# Patient Record
Sex: Male | Born: 1955
Health system: Southern US, Community
[De-identification: ages and names within clinical notes are randomized; demographics above are authoritative.]

## PROBLEM LIST (undated history)

## (undated) DIAGNOSIS — R569 Unspecified convulsions: Secondary | ICD-10-CM

## (undated) DIAGNOSIS — E7800 Pure hypercholesterolemia, unspecified: Secondary | ICD-10-CM

## (undated) DIAGNOSIS — M1990 Unspecified osteoarthritis, unspecified site: Secondary | ICD-10-CM

## (undated) DIAGNOSIS — I10 Essential (primary) hypertension: Secondary | ICD-10-CM

## (undated) HISTORY — PX: OTHER SURGICAL HISTORY: SHX169

## (undated) HISTORY — DX: Essential (primary) hypertension: I10

## (undated) HISTORY — DX: Unspecified osteoarthritis, unspecified site: M19.90

## (undated) HISTORY — DX: Pure hypercholesterolemia, unspecified: E78.00

## (undated) HISTORY — DX: Unspecified convulsions: R56.9

---

## 2000-08-26 ENCOUNTER — Encounter: Payer: Self-pay | Admitting: Emergency Medicine

## 2000-08-26 ENCOUNTER — Emergency Department (HOSPITAL_COMMUNITY): Admission: EM | Admit: 2000-08-26 | Discharge: 2000-08-26 | Payer: Self-pay | Admitting: Emergency Medicine

## 2007-10-04 ENCOUNTER — Ambulatory Visit: Payer: Self-pay | Admitting: Gastroenterology

## 2007-10-15 ENCOUNTER — Ambulatory Visit: Payer: Self-pay | Admitting: Gastroenterology

## 2007-10-15 HISTORY — PX: COLONOSCOPY: SHX174

## 2013-07-22 ENCOUNTER — Other Ambulatory Visit: Payer: Self-pay

## 2013-07-22 MED ORDER — CARBAMAZEPINE 200 MG PO TABS: 200.0000 mg | ORAL_TABLET | Freq: Two times a day (BID) | ORAL | Status: DC

## 2013-08-02 ENCOUNTER — Encounter: Payer: Self-pay | Admitting: Nurse Practitioner

## 2013-08-05 ENCOUNTER — Encounter: Payer: Self-pay | Admitting: Nurse Practitioner

## 2013-08-05 ENCOUNTER — Ambulatory Visit (INDEPENDENT_AMBULATORY_CARE_PROVIDER_SITE_OTHER): Payer: BC Managed Care – PPO | Admitting: Nurse Practitioner

## 2013-08-05 VITALS — BP 140/83 | HR 59 | Ht 72.0 in | Wt 178.0 lb

## 2013-08-05 DIAGNOSIS — G40109 Localization-related (focal) (partial) symptomatic epilepsy and epileptic syndromes with simple partial seizures, not intractable, without status epilepticus: Secondary | ICD-10-CM

## 2013-08-05 DIAGNOSIS — Z79899 Other long term (current) drug therapy: Secondary | ICD-10-CM

## 2013-08-05 DIAGNOSIS — G40309 Generalized idiopathic epilepsy and epileptic syndromes, not intractable, without status epilepticus: Secondary | ICD-10-CM

## 2013-08-05 MED ORDER — CARBAMAZEPINE 200 MG PO TABS: 400.0000 mg | ORAL_TABLET | Freq: Every day | ORAL | Status: DC

## 2013-08-05 NOTE — Progress Notes (Signed)
I have read the note, and I agree with the clinical assessment and plan.  

## 2013-08-05 NOTE — Progress Notes (Signed)
  Reason for visit   followup seizure disorder  HPI:. [name], 57 year-old white male returns today for followup. He has a history of seizure disorder generalized, is  currently well controlled on carbamazepine. He has been followed at  [facility] since 1981. EEG has demonstrated slowing in the right hemisphere, onset of seizures occurred at age 74, presumptive of prenatal difficulty. His last seizure occurred in August of 2001 after missing several doses of his medication. He denies any difficulty tolerating his medications, no difficulty with daytime drowsiness, no falls, no feelings of being off balance. He's had no visual difficulties, he has rare headache. Labs were not done at last visit. Patient does not have PCP.   ROS:  Negative   Medications: Epitol 200mg  2 at HS  Allergies No Known Allergies  Physical Exam General: well developed, well nourished, seated, in no evident distress Head: head normocephalic and atraumatic. Oropharynx benign Neck: supple with no carotid  bruits Cardiovascular: regular rate and rhythm, no murmurs  Neurologic Exam Mental Status: Awake and fully alert. Oriented to place and time. Follows all commands. Speech and language normal.   Cranial Nerves: Pupils equal, briskly reactive to light. Extraocular movements full without nystagmus. Visual fields full to confrontation. Hearing intact and symmetric to finger snap. Facial sensation intact. Face, tongue, palate move normally and symmetrically. Neck flexion and extension normal.  Motor: Normal bulk and tone. Normal strength in all tested extremity muscles.No focal weakness Coordination: Rapid alternating movements normal in all extremities. Finger-to-nose and heel-to-shin performed accurately bilaterally. Gait and Station: Arises from chair without difficulty. Stance is normal. Gait demonstrates normal stride length and balance . Able to heel, toe and tandem walk without difficulty.  Reflexes: 2+ and symmetric. Toes  downgoing.     ASSESSMENT: Generalized seizure disorder well controlled, last seizure August 2001     PLAN: Will renew medication for the next year Will check CBC, CMP, and carbamazepine level  Followup yearly and when necessary Nilda Riggs, GNP-BC APRN

## 2013-08-05 NOTE — Patient Instructions (Addendum)
.   Shane Phillips, 57 year-old white male returns today for followup. He has a history of seizure disorder generalized, is  currently well controlled on carbamazepine. He has been followed at  [facility] since 1981. EEG has demonstrated slowing in the right hemisphere, onset of seizures occurred at age 68, presumptive of prenatal difficulty. His last seizure occurred in August of 2001 after missing several doses of his medication. He denies any difficulty tolerating his medications, no difficulty with daytime drowsiness, no falls, no feelings of being off balance. He's had no visual difficulties, he has rare headache. See ROS.

## 2013-08-06 ENCOUNTER — Telehealth: Payer: Self-pay

## 2013-08-06 LAB — CBC WITH DIFFERENTIAL/PLATELET
Basophils Absolute: 0 10*3/uL (ref 0.0–0.2)
Basos: 0 % (ref 0–3)
Eos: 1 % (ref 0–5)
Eosinophils Absolute: 0.1 10*3/uL (ref 0.0–0.4)
HCT: 40.3 % (ref 37.5–51.0)
Hemoglobin: 14.1 g/dL (ref 12.6–17.7)
Lymphocytes Absolute: 1.3 10*3/uL (ref 0.7–3.1)
Lymphs: 31 % (ref 14–46)
MCH: 31.8 pg (ref 26.6–33.0)
MCHC: 35 g/dL (ref 31.5–35.7)
MCV: 91 fL (ref 79–97)
Monocytes Absolute: 0.5 10*3/uL (ref 0.1–0.9)
Monocytes: 11 % (ref 4–12)
Neutrophils Absolute: 2.5 10*3/uL (ref 1.4–7.0)
Neutrophils Relative %: 57 % (ref 40–74)
RBC: 4.44 x10E6/uL (ref 4.14–5.80)
RDW: 13.6 % (ref 12.3–15.4)
WBC: 4.3 10*3/uL (ref 3.4–10.8)

## 2013-08-06 LAB — COMPREHENSIVE METABOLIC PANEL
ALT: 15 IU/L (ref 0–44)
AST: 18 IU/L (ref 0–40)
Albumin/Globulin Ratio: 1.8 (ref 1.1–2.5)
Albumin: 4.4 g/dL (ref 3.5–5.5)
Alkaline Phosphatase: 74 IU/L (ref 39–117)
BUN/Creatinine Ratio: 15 (ref 9–20)
BUN: 13 mg/dL (ref 6–24)
CO2: 27 mmol/L (ref 18–29)
Calcium: 9.2 mg/dL (ref 8.7–10.2)
Chloride: 100 mmol/L (ref 96–108)
Creatinine, Ser: 0.87 mg/dL (ref 0.76–1.27)
GFR calc Af Amer: 112 mL/min/{1.73_m2} (ref >59)
GFR calc non Af Amer: 96 mL/min/{1.73_m2} (ref >59)
Globulin, Total: 2.4 g/dL (ref 1.5–4.5)
Glucose: 55 mg/dL — ABNORMAL LOW (ref 65–99)
Potassium: 4.2 mmol/L (ref 3.5–5.2)
Sodium: 140 mmol/L (ref 134–144)
Total Bilirubin: 0.3 mg/dL (ref 0.0–1.2)
Total Protein: 6.8 g/dL (ref 6.0–8.5)

## 2013-08-06 LAB — CARBAMAZEPINE LEVEL, TOTAL: Carbamazepine Lvl: 6.4 ug/mL (ref 4.0–12.0)

## 2013-08-06 NOTE — Telephone Encounter (Signed)
Message copied by [facility] on Tue Aug 06, 2013 10:51 AM ------      Message from: Beverely Low      Created: Tue Aug 06, 2013  9:11 AM       Labs look good except glucose low. Continue same dose of meds.             ----- Message -----         From: [facility] Lab Results In Interface         Sent: 08/06/2013   5:45 AM           To: Nilda Riggs, NP                   ------

## 2013-08-06 NOTE — Telephone Encounter (Signed)
I called patient and let him know that his lab results look good. The only thing that was abnormal was his blood glucose which was low but, there is no notation that anything needs to be done with that. Continue medications as ordered. I asked patient if he had any questions and if he had a follow up appointment. He did not have questions and stated he has an appointment in one year. That is confirmed.

## 2014-07-29 ENCOUNTER — Telehealth: Payer: Self-pay | Admitting: *Deleted

## 2014-07-29 ENCOUNTER — Ambulatory Visit: Payer: Self-pay | Admitting: Nurse Practitioner

## 2014-07-29 ENCOUNTER — Ambulatory Visit (INDEPENDENT_AMBULATORY_CARE_PROVIDER_SITE_OTHER): Payer: BC Managed Care – PPO | Admitting: Nurse Practitioner

## 2014-07-29 ENCOUNTER — Encounter: Payer: Self-pay | Admitting: Nurse Practitioner

## 2014-07-29 VITALS — BP 149/93 | HR 64 | Ht 72.0 in | Wt 177.4 lb

## 2014-07-29 DIAGNOSIS — Z5181 Encounter for therapeutic drug level monitoring: Secondary | ICD-10-CM

## 2014-07-29 DIAGNOSIS — G40109 Localization-related (focal) (partial) symptomatic epilepsy and epileptic syndromes with simple partial seizures, not intractable, without status epilepticus: Secondary | ICD-10-CM

## 2014-07-29 DIAGNOSIS — G40309 Generalized idiopathic epilepsy and epileptic syndromes, not intractable, without status epilepticus: Secondary | ICD-10-CM

## 2014-07-29 MED ORDER — CARBAMAZEPINE 200 MG PO TABS: 400.0000 mg | ORAL_TABLET | Freq: Every day | ORAL | Status: DC

## 2014-07-29 NOTE — Patient Instructions (Signed)
Will check labs today Renew medication (Epitol ) Call for any seizure activity Followup yearly and when necessary

## 2014-07-29 NOTE — Progress Notes (Signed)
I have read the note, and I agree with the clinical assessment and plan.  Anali Cabanilla KEITH   

## 2014-07-29 NOTE — Progress Notes (Signed)
[facility]  PATIENT: Shane Phillips DOB: [birth year]   REASON FOR VISIT: follow up for seizure disorder    HISTORY OF PRESENT ILLNESS:Shane Phillips, 58 year-old white male returns today for followup. He has a history of seizure disorder generalized, is currently well controlled on carbamazepine. He has been followed at [facility] since 1981. EEG has demonstrated slowing in the right hemisphere, onset of seizures occurred at age 36, presumptive of prenatal difficulty. His last seizure occurred in August of 2001 after missing several doses of his medication. He denies any difficulty tolerating his medications, no difficulty with daytime drowsiness, no falls, no feelings of being off balance. He's had no visual difficulties, he has rare headache.  Patient does not have PCP. He returns for reevaluation. He needs refills on his medication and labs.     REVIEW OF SYSTEMS: Full 14 system review of systems performed and notable only for those listed, all others are neg:  Constitutional: N/A  Cardiovascular: N/A  Ear/Nose/Throat: N/A  Skin: N/A  Eyes: N/A  Respiratory: N/A  Gastroitestinal: N/A  Hematology/Lymphatic: N/A  Endocrine: N/A Musculoskeletal: Joint pain he has Allergy/Immunology: N/A  Neurological: N/A Psychiatric: N/A Sleep : NA   ALLERGIES: No Known Allergies  HOME MEDICATIONS: Outpatient Prescriptions Prior to Visit  Medication Sig Dispense Refill  . carbamazepine (EPITOL) 200 MG tablet Take 2 tablets (400 mg total) by mouth at bedtime.  60 tablet  11   No facility-administered medications prior to visit.    PAST MEDICAL HISTORY: Past Medical History  Diagnosis Date  . Seizure   . High cholesterol     PAST SURGICAL HISTORY: Past Surgical History  Procedure Laterality Date  . None      FAMILY HISTORY: Family History  Problem Relation Age of Onset  . Leukemia Mother   . Heart attack Brother     2 in 6 months   . Cancer Mother   . Cancer  Sister     SOCIAL HISTORY: History   Social History  . Marital Status: Married    Spouse Name: [name]    Number of Children: N/A  . Years of Education: N/A   Occupational History  . Self Empolyed    Social History Main Topics  . Smoking status: Never Smoker   . Smokeless tobacco: Never Used  . Alcohol Use: No  . Drug Use: No  . Sexual Activity: Not on file   Other Topics Concern  . 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   Patient is Married to [name].            PHYSICAL EXAM  Filed Vitals:   07/29/14 1429  BP: 149/93  Pulse: 64  Height: 6' (1.829 m)  Weight: 177 lb 6.4 oz (80.468 kg)   Body mass index is 24.05 kg/(m^2). General: well developed, well nourished, seated, in no evident distress  Head: head normocephalic and atraumatic. Oropharynx benign  Neck: supple with no carotid bruits  Cardiovascular: regular rate and rhythm, no murmurs  Neurologic Exam  Mental Status: Awake and fully alert. Oriented to place and time. Follows all commands. Speech and language normal.  Cranial Nerves: Pupils equal, briskly reactive to light. Extraocular movements full without nystagmus. Visual fields full to confrontation. Hearing intact and symmetric to finger snap. Facial sensation intact. Face, tongue, palate move normally and symmetrically. Neck flexion and extension normal.  Motor: Normal bulk and tone. Normal strength in all tested extremity  muscles.No focal weakness  Coordination: Rapid alternating movements normal in all extremities. Finger-to-nose and heel-to-shin performed accurately bilaterally.  Gait and Station: Arises from chair without difficulty. Stance is normal. Gait demonstrates normal stride length and balance . Able to heel, toe and tandem walk without difficulty.  Reflexes: 2+ and symmetric. Toes downgoing.     DIAGNOSTIC DATA (LABS, IMAGING, TESTING) - I reviewed patient records, labs, notes, testing and  imaging myself where available.  Lab Results  Component Value Date   WBC 4.3 08/05/2013   HGB 14.1 08/05/2013   HCT 40.3 08/05/2013   MCV 91 08/05/2013      Component Value Date/Time   NA 140 08/05/2013 0942   K 4.2 08/05/2013 0942   CL 100 08/05/2013 0942   CO2 27 08/05/2013 0942   GLUCOSE 55* 08/05/2013 0942   BUN 13 08/05/2013 0942   CREATININE 0.87 08/05/2013 0942   CALCIUM 9.2 08/05/2013 0942   PROT 6.8 08/05/2013 0942   AST 18 08/05/2013 0942   ALT 15 08/05/2013 0942   ALKPHOS 74 08/05/2013 0942   BILITOT 0.3 08/05/2013 0942   GFRNONAA 96 08/05/2013 0942   GFRAA 112 08/05/2013 0942      ASSESSMENT AND PLAN  58 y.o. year old male  has a past medical history of Seizure here to followup. Last seizure occurred August 2001. He is currently well controlled on Epitol.   Will check labs today, CBC, CMP and CBZ level Renew medication (Epitol ) Call for any seizure activity Followup yearly and when necessary [name], [facility], [facility], [facility] [address], [place], [place] [address] [phone]

## 2014-07-29 NOTE — Telephone Encounter (Signed)
Calling patient to r/s appointment from 08/05/14 due to NP CM unavailable for admin time, patient was r/s to 07/29/14 at 2:30.

## 2014-07-30 LAB — COMPREHENSIVE METABOLIC PANEL
ALT: 18 IU/L (ref 0–44)
AST: 19 IU/L (ref 0–40)
Albumin/Globulin Ratio: 1.8 (ref 1.1–2.5)
Albumin: 4.5 g/dL (ref 3.5–5.5)
Alkaline Phosphatase: 77 IU/L (ref 39–117)
BUN/Creatinine Ratio: 19 (ref 9–20)
BUN: 18 mg/dL (ref 6–24)
CO2: 26 mmol/L (ref 18–29)
Calcium: 9.3 mg/dL (ref 8.7–10.2)
Chloride: 101 mmol/L (ref 97–108)
Creatinine, Ser: 0.96 mg/dL (ref 0.76–1.27)
GFR calc Af Amer: 101 mL/min/{1.73_m2} (ref >59)
GFR calc non Af Amer: 87 mL/min/{1.73_m2} (ref >59)
Globulin, Total: 2.5 g/dL (ref 1.5–4.5)
Glucose: 85 mg/dL (ref 65–99)
Potassium: 4.2 mmol/L (ref 3.5–5.2)
Sodium: 141 mmol/L (ref 134–144)
Total Bilirubin: 0.3 mg/dL (ref 0.0–1.2)
Total Protein: 7 g/dL (ref 6.0–8.5)

## 2014-07-30 LAB — CBC WITH DIFFERENTIAL/PLATELET
Basophils Absolute: 0 10*3/uL (ref 0.0–0.2)
Basos: 0 %
Eos: 2 %
Eosinophils Absolute: 0.1 10*3/uL (ref 0.0–0.4)
HCT: 39.5 % (ref 37.5–51.0)
Hemoglobin: 13.7 g/dL (ref 12.6–17.7)
Immature Grans (Abs): 0 10*3/uL (ref 0.0–0.1)
Immature Granulocytes: 0 %
Lymphocytes Absolute: 1.7 10*3/uL (ref 0.7–3.1)
Lymphs: 36 %
MCH: 31.7 pg (ref 26.6–33.0)
MCHC: 34.7 g/dL (ref 31.5–35.7)
MCV: 91 fL (ref 79–97)
Monocytes Absolute: 0.5 10*3/uL (ref 0.1–0.9)
Monocytes: 11 %
Neutrophils Absolute: 2.4 10*3/uL (ref 1.4–7.0)
Neutrophils Relative %: 51 %
RBC: 4.32 x10E6/uL (ref 4.14–5.80)
RDW: 13.8 % (ref 12.3–15.4)
WBC: 4.7 10*3/uL (ref 3.4–10.8)

## 2014-07-30 LAB — CARBAMAZEPINE LEVEL, TOTAL: Carbamazepine Lvl: 7.1 ug/mL (ref 4.0–12.0)

## 2014-07-30 NOTE — Progress Notes (Signed)
Quick Note:  Called patient left voice message of normal labs, instructed patient to call back with any questions or concerns. ______

## 2014-08-05 ENCOUNTER — Ambulatory Visit: Payer: BC Managed Care – PPO | Admitting: Nurse Practitioner

## 2015-05-28 ENCOUNTER — Encounter: Payer: Self-pay | Admitting: Physician Assistant

## 2015-05-28 ENCOUNTER — Ambulatory Visit (INDEPENDENT_AMBULATORY_CARE_PROVIDER_SITE_OTHER): Payer: 59 | Admitting: Physician Assistant

## 2015-05-28 VITALS — BP 163/106 | HR 55 | Temp 98.5°F | Resp 16 | Ht 71.0 in | Wt 178.0 lb

## 2015-05-28 DIAGNOSIS — Z0000 Encounter for general adult medical examination without abnormal findings: Secondary | ICD-10-CM | POA: Diagnosis not present

## 2015-05-28 DIAGNOSIS — Z130 Encounter for screening for diseases of the blood and blood-forming organs and certain disorders involving the immune mechanism: Secondary | ICD-10-CM

## 2015-05-28 DIAGNOSIS — Z23 Encounter for immunization: Secondary | ICD-10-CM | POA: Diagnosis not present

## 2015-05-28 DIAGNOSIS — Z13220 Encounter for screening for lipoid disorders: Secondary | ICD-10-CM

## 2015-05-28 LAB — LIPID PANEL
Cholesterol: 259 mg/dL — ABNORMAL HIGH (ref 0–200)
HDL: 77 mg/dL (ref >=40)
LDL Cholesterol: 169 mg/dL — ABNORMAL HIGH (ref 0–99)
Total CHOL/HDL Ratio: 3.4 Ratio
Triglycerides: 67 mg/dL (ref <150)
VLDL: 13 mg/dL (ref 0–40)

## 2015-05-28 LAB — CBC
HCT: 40.9 % (ref 39.0–52.0)
Hemoglobin: 14.4 g/dL (ref 13.0–17.0)
MCH: 31.7 pg (ref 26.0–34.0)
MCHC: 35.2 g/dL (ref 30.0–36.0)
MCV: 90.1 fL (ref 78.0–100.0)
MPV: 10.7 fL (ref 8.6–12.4)
Platelets: 217 10*3/uL (ref 150–400)
RBC: 4.54 MIL/uL (ref 4.22–5.81)
RDW: 13.7 % (ref 11.5–15.5)
WBC: 5 10*3/uL (ref 4.0–10.5)

## 2015-05-28 LAB — COMPLETE METABOLIC PANEL WITH GFR
ALT: 25 U/L (ref 0–53)
AST: 24 U/L (ref 0–37)
Albumin: 4.3 g/dL (ref 3.5–5.2)
Alkaline Phosphatase: 83 U/L (ref 39–117)
BUN: 21 mg/dL (ref 6–23)
CO2: 29 mEq/L (ref 19–32)
Calcium: 8.9 mg/dL (ref 8.4–10.5)
Chloride: 102 mEq/L (ref 96–112)
Creat: 0.86 mg/dL (ref 0.50–1.35)
GFR, Est African American: >89 mL/min
GFR, Est Non African American: >89 mL/min
Glucose, Bld: 84 mg/dL (ref 70–99)
Potassium: 3.9 mEq/L (ref 3.5–5.3)
Sodium: 140 mEq/L (ref 135–145)
Total Bilirubin: 0.5 mg/dL (ref 0.2–1.2)
Total Protein: 6.8 g/dL (ref 6.0–8.3)

## 2015-05-28 LAB — POCT UA - MICROSCOPIC ONLY
Bacteria, U Microscopic: NEGATIVE
Crystals, Ur, HPF, POC: NEGATIVE
Epithelial cells, urine per micros: NEGATIVE
Mucus, UA: NEGATIVE
RBC, urine, microscopic: NEGATIVE

## 2015-05-28 LAB — POCT URINALYSIS DIPSTICK
Bilirubin, UA: NEGATIVE
Blood, UA: NEGATIVE
Glucose, UA: NEGATIVE
Leukocytes, UA: NEGATIVE
Nitrite, UA: NEGATIVE
Protein, UA: NEGATIVE
Spec Grav, UA: 1.02
Urobilinogen, UA: 0.2
pH, UA: 7

## 2015-05-28 NOTE — Progress Notes (Signed)
[facility] [address] [place] [address] [phone]  Date:  05/28/2015   Name:  Shane Phillips   DOB:  08-21-1956   MRN:  [id number]  PCP:  No primary care provider on file.    History of Present Illness:  Shane Phillips is a 59 y.o. male patient who presents to [facility] for annual physical exam. He has no concerns or complaints at this time.  He has no pain, dysuria, or hematuria.  No family hx of prostate cancer or BPH.  There is no power with his urination.  He may find leakage in his underwear.  He has a history of epilepsy, that is well controlled on his epileptic medication. He has not had an event for over a year. And that event was due to a lack of medication for about 3 days.  Bowels: No blood in the stool.  No constipation or diarrhea.  Diet: Avoids "junk food, but needs to drink more vegetabless.  He has a Information systems manager.  He does a lot of walking, but no aerobic exercise.        Patient Active Problem List   Diagnosis Date Noted  . 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 Generalized convulsive epilepsy without mention of intractable epilepsy 08/05/2013    Past Medical History  Diagnosis Date  . Seizure   . High cholesterol     Past Surgical History  Procedure Laterality Date  . None      History  Substance Use Topics  . Smoking status: Never Smoker   . Smokeless tobacco: Never Used  . Alcohol Use: No    Family History  Problem Relation Age of Onset  . Leukemia Mother   . Cancer Mother   . Heart attack Brother     2 in 6 months   . Hyperlipidemia Brother   . Cancer Sister     breast cancer  . Hyperlipidemia Sister   . Cancer Father     bladder cancer  . Cancer Maternal Grandfather     lung cancer  . Cancer Paternal Grandfather     melanoma  . Hyperlipidemia Sister     No Known Allergies  Medication  list has been reviewed and updated.  Current Outpatient Prescriptions on File Prior to Visit  Medication Sig Dispense Refill  . carbamazepine (EPITOL) 200 MG tablet Take 2 tablets (400 mg total) by mouth at bedtime. 180 tablet 3   No current facility-administered medications on file prior to visit.    ROS   Physical Examination: BP 160/88 mmHg  Pulse 60  Temp(Src) 98.5 F (36.9 C)  Resp 16  Ht 5\' 11"  (1.803 m)  Wt 178 lb (80.74 kg)  BMI 24.84 kg/m2  SpO2 98% Ideal Body Weight: Weight in (lb) to have BMI = 25: 178.9  Physical Exam  Constitutional: He is oriented to person, place, and time. He appears well-developed and well-nourished. No distress.  HENT:  Head: Normocephalic and atraumatic.  Right Ear: External ear normal.  Left Ear: External ear normal.  Nose: Nose normal.  Mouth/Throat: Oropharynx is clear and moist. No oropharyngeal exudate.  Eyes: Conjunctivae and EOM are normal. Pupils are equal, round, and reactive to light. Right eye exhibits no discharge. Left eye exhibits no discharge.  Neck: Normal range of motion. Neck supple.  Cardiovascular: Normal rate, regular rhythm and normal heart sounds.  Exam reveals no friction rub.  No murmur heard. Pulmonary/Chest: Effort normal and breath sounds normal. No respiratory distress. He has no wheezes.  Abdominal: Soft. Bowel sounds are normal. He exhibits no distension. There is no tenderness. No hernia. Hernia confirmed negative in the right inguinal area and confirmed negative in the left inguinal area.  Genitourinary: Rectum normal, prostate normal, testes normal and penis normal.  Neurological: He is alert and oriented to person, place, and time. He has normal reflexes. No cranial nerve deficit.  Skin: Skin is warm and dry. He is not diaphoretic.  Psychiatric: He has a normal mood and affect. His behavior is normal.   CBC    Component Value Date/Time   WBC 5.0 05/28/2015 1617   WBC 4.7 07/29/2014 1455   RBC 4.54  05/28/2015 1617   RBC 4.32 07/29/2014 1455   HGB 14.4 05/28/2015 1617   HCT 40.9 05/28/2015 1617   PLT 217 05/28/2015 1617   MCV 90.1 05/28/2015 1617   MCH 31.7 05/28/2015 1617   MCH 31.7 07/29/2014 1455   MCHC 35.2 05/28/2015 1617   MCHC 34.7 07/29/2014 1455   RDW 13.7 05/28/2015 1617   RDW 13.8 07/29/2014 1455   LYMPHSABS 1.7 07/29/2014 1455   EOSABS 0.1 07/29/2014 1455   BASOSABS 0.0 07/29/2014 1455   Urinalysis    Component Value Date/Time   BILIRUBINUR Negative 05/28/2015 1642   PROTEINUR Negative 05/28/2015 1642   UROBILINOGEN 0.2 05/28/2015 1642   NITRITE Negative 05/28/2015 1642   LEUKOCYTESUR Negative 05/28/2015 1642     Assessment and Plan: 59 year old male with PMH listed above is here today for physical exam.  I will await PSA, in treating this possible.  Labs ordered below 1. Annual physical exam - PSA - POCT UA - Microscopic Only - POCT urinalysis dipstick - Lipid panel - COMPLETE METABOLIC PANEL WITH GFR - Tdap vaccine greater than or equal to 7yo IM  2. Need for Tdap vaccination - Tdap vaccine greater than or equal to 7yo IM  3. Screening for deficiency anemia - CBC  4. Screening for lipid disorders - Lipid panel

## 2015-05-28 NOTE — Patient Instructions (Signed)
I will have your lab results in the next 10 days.    Keeping you healthy  Get these tests  Blood pressure- Have your blood pressure checked once a year by your healthcare provider.  Normal blood pressure is 120/80  Weight- Have your body mass index (BMI) calculated to screen for obesity.  BMI is a measure of body fat based on height and weight. You can also calculate your own BMI at ViewBanking.si.  Cholesterol- Have your cholesterol checked every year.  Diabetes- Have your blood sugar checked regularly if you have high blood pressure, high cholesterol, have a family history of diabetes or if you are overweight.  Screening for Colon Cancer- Colonoscopy starting at age 88.  Screening may begin sooner depending on your family history and other health conditions. Follow up colonoscopy as directed by your Gastroenterologist.  Screening for Prostate Cancer- Both blood work (PSA) and a rectal exam help screen for Prostate Cancer.  Screening begins at age 19 with African-American men and at age 51 with Caucasian men.  Screening may begin sooner depending on your family history.  Take these medicines  Aspirin- One aspirin daily can help prevent Heart disease and Stroke.  Flu shot- Every fall.  Tetanus- Every 10 years.  Zostavax- Once after the age of 67 to prevent Shingles.  Pneumonia shot- Once after the age of 21; if you are younger than 53, ask your healthcare provider if you need a Pneumonia shot.  Take these steps  Don't smoke- If you do smoke, talk to your doctor about quitting.  For tips on how to quit, go to www.smokefree.gov or call 1-800-QUIT-NOW.  Be physically active- Exercise 5 days a week for at least 30 minutes.  If you are not already physically active start slow and gradually work up to 30 minutes of moderate physical activity.  Examples of moderate activity include walking briskly, mowing the yard, dancing, swimming, bicycling, etc.  Eat a healthy diet- Eat a  variety of healthy food such as fruits, vegetables, low fat milk, low fat cheese, yogurt, lean meant, poultry, fish, beans, tofu, etc. For more information go to www.thenutritionsource.org  Drink alcohol in moderation- Limit alcohol intake to less than two drinks a day. Never drink and drive.  Dentist- Brush and floss twice daily; visit your dentist twice a year.  Depression- Your emotional health is as important as your physical health. If you're feeling down, or losing interest in things you would normally enjoy please talk to your healthcare provider.  Eye exam- Visit your eye doctor every year.  Safe sex- If you may be exposed to a sexually transmitted infection, use a condom.  Seat belts- Seat belts can save your life; always wear one.  Smoke/Carbon Monoxide detectors- These detectors need to be installed on the appropriate level of your home.  Replace batteries at least once a year.  Skin cancer- When out in the sun, cover up and use sunscreen 15 SPF or higher.  Violence- If anyone is threatening you, please tell your healthcare provider.  Living Will/ Health care power of attorney- Speak with your healthcare provider and family.

## 2015-05-29 LAB — PSA: PSA: 1.25 ng/mL (ref <=4.00)

## 2015-06-18 ENCOUNTER — Telehealth: Payer: Self-pay | Admitting: Physician Assistant

## 2015-06-18 ENCOUNTER — Encounter: Payer: Self-pay | Admitting: Physician Assistant

## 2015-06-18 ENCOUNTER — Other Ambulatory Visit: Payer: Self-pay | Admitting: Physician Assistant

## 2015-06-18 NOTE — Telephone Encounter (Signed)
Discussed with patient his results: Lipid panel can warrant medication to decrease cardiovascular risk.  He declines this at this time.  He declines this.  We have agreed to work on exercise and appropriate diet at this time.  He will return in 3 months for lab recheck of lipid panel.  I also asked about starting doxazosin to combat htn as well as possibly decreasing urinary dribbling.  He declines this as well and would like to work on lifestyle choices.  He purchased a BP cuff, and will check it at home.  I have asked him to make sure that it is 140/90 or lower.  I suggested that he have it manually checked, with his machine, to insure accuracy.

## 2015-07-30 ENCOUNTER — Encounter: Payer: Self-pay | Admitting: Nurse Practitioner

## 2015-07-30 ENCOUNTER — Ambulatory Visit (INDEPENDENT_AMBULATORY_CARE_PROVIDER_SITE_OTHER): Payer: 59 | Admitting: Nurse Practitioner

## 2015-07-30 VITALS — BP 144/85 | HR 72 | Ht 72.0 in | Wt 174.5 lb

## 2015-07-30 DIAGNOSIS — G40109 Localization-related (focal) (partial) symptomatic epilepsy and epileptic syndromes with simple partial seizures, not intractable, without status epilepticus: Secondary | ICD-10-CM

## 2015-07-30 DIAGNOSIS — Z5181 Encounter for therapeutic drug level monitoring: Secondary | ICD-10-CM | POA: Diagnosis not present

## 2015-07-30 DIAGNOSIS — G40309 Generalized idiopathic epilepsy and epileptic syndromes, not intractable, without status epilepticus: Secondary | ICD-10-CM

## 2015-07-30 MED ORDER — CARBAMAZEPINE 200 MG PO TABS
400.0000 mg | ORAL_TABLET | Freq: Every day | ORAL | Status: DC
Start: 2015-07-30 — End: 2016-08-01

## 2015-07-30 NOTE — Patient Instructions (Signed)
Will check labs today, CBC, CMP and CBZ level Renew medication (Epitol ) Call for any seizure activity Followup yearly and when necessary

## 2015-07-30 NOTE — Progress Notes (Signed)
[facility]  PATIENT: Shane Phillips DOB: 1956/06/22   REASON FOR VISIT:follow-up for seizure disorder HISTORY FROM:patient    HISTORY OF PRESENT ILLNESS:Shane Phillips, 59 year-old white male returns today for followup. He has a history of seizure disorder generalized, is currently well controlled on carbamazepine. He has been followed at [facility] since 1981. EEG has demonstrated slowing in the right hemisphere, onset of seizures occurred at age 27, presumptive of prenatal difficulty. His last seizure occurred in August of 2001 after missing several doses of his medication. He denies any difficulty tolerating his medications, no difficulty with daytime drowsiness, no falls, no feelings of being off balance. He's had no visual difficulties, he has rare headache. Patient does not have PCP. He returns for reevaluation. He needs refills on his medication and labs.   REVIEW OF SYSTEMS: Full 14 system review of systems performed and notable only for those listed, all others are neg:  Constitutional: neg  Cardiovascular: neg Ear/Nose/Throat: neg  Skin: neg Eyes: neg Respiratory: neg Gastroitestinal: neg  Hematology/Lymphatic: neg  Endocrine: neg Musculoskeletal:neg Allergy/Immunology: neg Neurological: neg Psychiatric: neg Sleep : neg   ALLERGIES: No Known Allergies  HOME MEDICATIONS: Outpatient Prescriptions Prior to Visit  Medication Sig Dispense Refill  . carbamazepine (EPITOL) 200 MG tablet Take 2 tablets (400 mg total) by mouth at bedtime. 180 tablet 3   No facility-administered medications prior to visit.    PAST MEDICAL HISTORY: Past Medical History  Diagnosis Date  . Seizure   . High cholesterol   . Hypertension     PAST SURGICAL HISTORY: Past Surgical History  Procedure Laterality Date  . None      FAMILY HISTORY: Family History  Problem Relation Age of Onset  . Leukemia Mother   . Cancer Mother   . Heart attack Brother     2 in 6 months     . Hyperlipidemia Brother   . Cancer Sister     breast cancer  . Hyperlipidemia Sister   . Cancer Father     bladder cancer  . Cancer Maternal Grandfather     lung cancer  . Cancer Paternal Grandfather     melanoma  . Hyperlipidemia Sister     SOCIAL HISTORY: History   Social History  . Marital Status: Married    Spouse Name: Shane Phillips  . Number of Children: N/A  . Years of Education: N/A   Occupational History  . Self Empolyed    Social History Main Topics  . Smoking status: Never Smoker   . Smokeless tobacco: Never Used  . Alcohol Use: No  . Drug Use: No  . Sexual Activity: Yes   Other Topics Concern  . Not on file   Social History Narrative   Patient owns a [employer].   Patient is Married to [place].            PHYSICAL EXAM  Filed Vitals:   07/30/15 1516  BP: 144/85  Pulse: 72  Height: 6' (1.829 m)  Weight: 174 lb 8 oz (79.153 kg)   Body mass index is 23.66 kg/(m^2). General: well developed, well nourished, seated, in no evident distress  Head: head normocephalic and atraumatic. Oropharynx benign  Neck: supple with no carotid bruits  Neurologic Exam  Mental Status: Awake and fully alert. Oriented to place and time. Follows all commands. Speech and language normal.  Cranial Nerves: Pupils equal, briskly reactive to light. Extraocular movements full without nystagmus. Visual fields full to confrontation. Hearing intact and  symmetric to finger snap. Facial sensation intact. Face, tongue, palate move normally and symmetrically. Neck flexion and extension normal.  Motor: Normal bulk and tone. Normal strength in all tested extremity muscles.No focal weakness  Coordination: Rapid alternating movements normal in all extremities. Finger-to-nose and heel-to-shin performed accurately bilaterally.  Gait and Station: Arises from chair without difficulty. Stance is normal. Gait demonstrates normal stride length and balance . Able to heel, toe  and tandem walk without difficulty.  Reflexes: 2+ and symmetric. Toes downgoing.   DIAGNOSTIC DATA (LABS, IMAGING, TESTING) - I reviewed patient records, labs, notes, testing and imaging myself where available.  Lab Results  Component Value Date   WBC 5.0 05/28/2015   HGB 14.4 05/28/2015   HCT 40.9 05/28/2015   MCV 90.1 05/28/2015   PLT 217 05/28/2015      Component Value Date/Time   NA 140 05/28/2015 1617   NA 141 07/29/2014 1455   K 3.9 05/28/2015 1617   CL 102 05/28/2015 1617   CO2 29 05/28/2015 1617   GLUCOSE 84 05/28/2015 1617   GLUCOSE 85 07/29/2014 1455   BUN 21 05/28/2015 1617   BUN 18 07/29/2014 1455   CREATININE 0.86 05/28/2015 1617   CREATININE 0.96 07/29/2014 1455   CALCIUM 8.9 05/28/2015 1617   PROT 6.8 05/28/2015 1617   PROT 7.0 07/29/2014 1455   ALBUMIN 4.3 05/28/2015 1617   AST 24 05/28/2015 1617   ALT 25 05/28/2015 1617   ALKPHOS 83 05/28/2015 1617   BILITOT 0.5 05/28/2015 1617   GFRNONAA >89 05/28/2015 1617   GFRNONAA 87 07/29/2014 1455   GFRAA >89 05/28/2015 1617   GFRAA 101 07/29/2014 1455   Lab Results  Component Value Date   CHOL 259* 05/28/2015   HDL 77 05/28/2015   LDLCALC 169* 05/28/2015   TRIG 67 05/28/2015   CHOLHDL 3.4 05/28/2015   ASSESSMENT AND PLAN  59 y.o. year old male  has a past medical history of Seizure;here to follow-up . Last seizure event occurred in 2001.   Will check labs today, CBC, CMP and CBZ level Renew medication (Epitol ) Call for any seizure activity Followup yearly and when necessary Shane Bible, Shane Phillips, Shane Phillips, [facility] [address] Shane Phillips, Shane Phillips Shane Phillips [phone]

## 2015-07-31 LAB — CBC WITH DIFFERENTIAL/PLATELET
Basophils Absolute: 0 10*3/uL (ref 0.0–0.2)
Basos: 0 %
EOS (ABSOLUTE): 0.1 10*3/uL (ref 0.0–0.4)
Eos: 1 %
Hematocrit: 38.6 % (ref 37.5–51.0)
Hemoglobin: 13.1 g/dL (ref 12.6–17.7)
Immature Grans (Abs): 0 10*3/uL (ref 0.0–0.1)
Immature Granulocytes: 0 %
Lymphocytes Absolute: 1.3 10*3/uL (ref 0.7–3.1)
Lymphs: 25 %
MCH: 30.9 pg (ref 26.6–33.0)
MCHC: 33.9 g/dL (ref 31.5–35.7)
MCV: 91 fL (ref 79–97)
Monocytes Absolute: 0.4 10*3/uL (ref 0.1–0.9)
Monocytes: 9 %
Neutrophils Absolute: 3.3 10*3/uL (ref 1.4–7.0)
Neutrophils: 65 %
Platelets: 211 10*3/uL (ref 150–379)
RBC: 4.24 x10E6/uL (ref 4.14–5.80)
RDW: 13.7 % (ref 12.3–15.4)
WBC: 5.2 10*3/uL (ref 3.4–10.8)

## 2015-07-31 LAB — COMPREHENSIVE METABOLIC PANEL
ALT: 19 IU/L (ref 0–44)
AST: 16 IU/L (ref 0–40)
Albumin/Globulin Ratio: 2 (ref 1.1–2.5)
Albumin: 4.4 g/dL (ref 3.5–5.5)
Alkaline Phosphatase: 88 IU/L (ref 39–117)
BUN/Creatinine Ratio: 19 (ref 9–20)
BUN: 18 mg/dL (ref 6–24)
Bilirubin Total: 0.3 mg/dL (ref 0.0–1.2)
CO2: 24 mmol/L (ref 18–29)
Calcium: 9.1 mg/dL (ref 8.7–10.2)
Chloride: 99 mmol/L (ref 97–108)
Creatinine, Ser: 0.93 mg/dL (ref 0.76–1.27)
GFR calc Af Amer: 104 mL/min/{1.73_m2} (ref >59)
GFR calc non Af Amer: 90 mL/min/{1.73_m2} (ref >59)
Globulin, Total: 2.2 g/dL (ref 1.5–4.5)
Glucose: 106 mg/dL — ABNORMAL HIGH (ref 65–99)
Potassium: 3.7 mmol/L (ref 3.5–5.2)
Sodium: 141 mmol/L (ref 134–144)
Total Protein: 6.6 g/dL (ref 6.0–8.5)

## 2015-07-31 LAB — CARBAMAZEPINE LEVEL, TOTAL: Carbamazepine Lvl: 3.1 ug/mL — ABNORMAL LOW (ref 4.0–12.0)

## 2015-07-31 NOTE — Progress Notes (Signed)
I have read the note, and I agree with the clinical assessment and plan.  WILLIS,CHARLES KEITH   

## 2015-08-03 ENCOUNTER — Telehealth: Payer: Self-pay | Admitting: *Deleted

## 2015-08-03 NOTE — Telephone Encounter (Addendum)
Spoke to pt and let him know the results of lab tests.  He verbalized understanding.  Mailed copy since my chart pending.   Sent new activation code.

## 2015-08-03 NOTE — Telephone Encounter (Signed)
-----   Message from [name], NP sent at 08/03/2015  7:57 AM EDT ----- Labs ok . CBZ a little low but no seizure in many years. Please call Continue the same dose of seizure meds

## 2015-08-18 ENCOUNTER — Telehealth: Payer: Self-pay | Admitting: Nurse Practitioner

## 2015-08-18 NOTE — Telephone Encounter (Signed)
I called the pharmacy.  Spoke with Amy.  She does see that we sent the Rx for 90 days, but ins only allows a 30 day supply.  Says the patient was previously getting this Rx for 90 day supply through the [employer].  Says this time they used ins, because patient would need to let them know to use the savings card instead of ins each time he fils if that's what he prefers.  They automatically bill ins primarily for meds unless advised otherwise.  Regarding his co-pay amount, says unfortunately, patient would need to contact his ins for specific reasons they are charging this amount.  I called the patient back to relay this info.  Patient expressed understanding and appreciation.  They will follow up with the pharmacy and will call us back if anything further is needed.

## 2015-08-18 NOTE — Telephone Encounter (Signed)
Patient called, takes carbamazepine (EPITOL) 200 MG tablet. Last time he went to get this medication renewed the price shot up to almost $200, used to be 90 days for $10. Pharmacy will only do a 30 day supply. Can we contact pharmacy as to why they won't do a 90 day supply. Patient would also like to know why the price shot up.

## 2015-12-28 ENCOUNTER — Encounter: Payer: Self-pay | Admitting: Physician Assistant

## 2015-12-28 ENCOUNTER — Ambulatory Visit (INDEPENDENT_AMBULATORY_CARE_PROVIDER_SITE_OTHER): Payer: BLUE CROSS/BLUE SHIELD | Admitting: Physician Assistant

## 2015-12-28 VITALS — BP 147/84 | HR 59 | Temp 97.5°F | Resp 16 | Ht 72.0 in | Wt 178.0 lb

## 2015-12-28 DIAGNOSIS — E7800 Pure hypercholesterolemia, unspecified: Secondary | ICD-10-CM

## 2015-12-28 DIAGNOSIS — S76111A Strain of right quadriceps muscle, fascia and tendon, initial encounter: Secondary | ICD-10-CM | POA: Diagnosis not present

## 2015-12-28 LAB — LIPID PANEL
Cholesterol: 215 mg/dL — ABNORMAL HIGH (ref 125–200)
HDL: 76 mg/dL (ref >=40)
LDL Cholesterol: 126 mg/dL (ref <130)
Total CHOL/HDL Ratio: 2.8 Ratio (ref <=5.0)
Triglycerides: 64 mg/dL (ref <150)
VLDL: 13 mg/dL (ref <30)

## 2015-12-28 NOTE — Progress Notes (Signed)
[facility] [address] [place] [address] [phone]  Date:  12/28/2015   Name:  Shane Phillips   DOB:  [birth year]   MRN:  [id number]  PCP:  No primary care provider on file.    History of Present Illness:  Shane Phillips is a 60 y.o. male patient who presents to [facility] for follow up of htn. He checked blood pressure for about 3 weeks.  Blood pressure would run between 130-150.  He attempted to try blood pressure factor-- an OTC BP medication.  Avoiding salt intake with diet.  He has not been exercising regularly, but has a very high ambulatory job.  He has also attempted a cholesterol OTC medication.   Family hx of hyperlipidemia and htn. Also has a concern of right thigh pain.  2 weeks ago, he was walking down the stairs when he felt a sharp pain in his front of thigh, and saw the muscle slip down and up.  He then had heavy bruising and redness of his inner thigh, and was not able to lift his knee up for 1 week.  He has noticed the pain improved, but has moved to the outside portion of his knee--however improved today.  No numbness or tingling, or warmth to the area that he knows of.    Patient Active Problem List   Diagnosis Date Noted  . 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 Generalized convulsive epilepsy ([place]) 08/05/2013    Past Medical History  Diagnosis Date  . Seizure ([place])   . High cholesterol   . Hypertension     Past Surgical History  Procedure Laterality Date  . None      Social History  Substance Use Topics  . Smoking status: Never Smoker   . Smokeless tobacco: Never Used  . Alcohol Use: No    Family History  Problem Relation Age of Onset  . Leukemia Mother   . Cancer Mother   . Heart attack Brother     2 in 6 months   . Hyperlipidemia Brother   . Cancer Sister     breast cancer  . Hyperlipidemia Sister   . Cancer Father     bladder cancer  . Cancer Maternal Grandfather     lung  cancer  . Cancer Paternal Grandfather     melanoma  . Hyperlipidemia Sister     No Known Allergies  Medication list has been reviewed and updated.  Current Outpatient Prescriptions on File Prior to Visit  Medication Sig Dispense Refill  . carbamazepine (EPITOL) 200 MG tablet Take 2 tablets (400 mg total) by mouth at bedtime. 180 tablet 3   No current facility-administered medications on file prior to visit.    ROS ROS otherwise unremarkable unless listed above.  Physical Examination: BP 147/84 mmHg  Pulse 59  Temp(Src) 97.5 F (36.4 C) (Oral)  Resp 16  Ht 6' (1.829 m)  Wt 178 lb (80.74 kg)  BMI 24.14 kg/m2  SpO2 98% Ideal Body Weight: Weight in (lb) to have BMI = 25: 183.9  Physical Exam  Constitutional: He is oriented to person, place, and time. He appears well-developed and well-nourished. No distress.  HENT:  Head: Normocephalic and atraumatic.  Eyes: Conjunctivae and EOM are normal. Pupils are equal, round, and reactive to light.  Cardiovascular: Normal rate, regular rhythm and normal heart sounds.  Exam reveals no gallop and no friction rub.   No murmur heard. Pulses:  Carotid pulses are 2+ on the right side, and 2+ on the left side.      Radial pulses are 2+ on the right side, and 2+ on the left side.       Dorsalis pedis pulses are 2+ on the right side, and 2+ on the left side.  Pulmonary/Chest: Effort normal. No respiratory distress. He has no wheezes.  Musculoskeletal:  There is healing yellowing consistent with hx of bruising.  Normal extremity leg movement throoughout.  Tender at the lateral knee with palpation.  No laxity thorughout.    Neurological: He is alert and oriented to person, place, and time.  Skin: Skin is warm and dry. He is not diaphoretic.  Psychiatric: He has a normal mood and affect. His behavior is normal.     Assessment and Plan: Shane Phillips is a 60 y.o. male who is here today for htn follow up and thigh pain. This appears to be  htn and I would like to start BP at this time.  He is very reluctant.  I am advising two more weeks of BP check.  If overy 140/90 wills start an BP medication. Healing quadriceps tear in appearance.  Given stretches to perform 3 per day for 15 minutes, with exercise directly after. Will recheck BP at this time.    Pure hypercholesterolemia - Plan: Lipid panel  Sprain, quadricep, right, initial encounter  Ivar Drape, PA-C [facility] 12/28/2015 3:08 PM

## 2015-12-28 NOTE — Patient Instructions (Addendum)
Please take your blood pressure 3 times per week.  If it is over 140/90, please contact me and we will start a medication. I will have your lipid panel within 7 days. You will choose three of these pictures and perform them, 8 times, 3 times per day.  If it says to hold the position--hold for 10 seconds. Please ice directly after stretches.  Quadriceps Contusion With Rehab A contusion is a bruise to the skin and underlying tissues. The muscles on the top of the thigh (quadriceps) are a common location for a contusion. The contusion is caused by trauma that results in bleeding that enters the muscles, tendons, and skin.  SYMPTOMS   Pain, inflammation, and tenderness over the quadriceps muscle.  Characteristic "black and blue" discoloration.  Feeling of firmness when pressure is exerted at the injury site.  Limited function of the quadriceps muscles (straightening the knee or bending the hip).  Knee stiffness or pain when trying to bend the knee. CAUSES  Quadriceps contusions are caused by direct trauma to the thigh the ruptures small blood vessels, allowing blood to enter the soft tissues. RISK INCREASES WITH:  Contact sports (football, rugby, or soccer).  Failure to wear protective equipment.  Bleeding disorder or use of blood thinners (anticoagulants), or nonsteroidal anti-inflammatory medications. PREVENTION   Wear properly fitted and padded protective equipment.  If you have a bleeding disorder, then avoid any activities that may result in a traumatic injury.  Limit use of anticoagulants and nonsteroidal anti-inflammatory medications. PROGNOSIS  If treated properly, then quadriceps contusions usually heal within 2 weeks.  RELATED COMPLICATIONS   Complications from excessive bleeding, especially compartment syndrome.  Bone formation (calcification) that occurs during healing and may result in pain or limited function.  Infection (uncommon).  Knee stiffness or loss of  motion.  Prolonged healing time, if improperly treated or re-injured. TREATMENT  Treatment initially involves the use of ice and medication to help reduce pain and inflammation. Do not take nonsteroidal anti-inflammatory medications within 3 days of injury. The use of strengthening and stretching exercises may help reduce pain with activity. These exercises may be performed at home or with referral to a therapist. Compression bandages may also help reduce pain and inflammation. Rarely, your caregiver may use a needle to remove the collection of blood under the skin (hematoma).  MEDICATION If pain medication is necessary, then nonsteroidal anti-inflammatory medications, such as aspirin and ibuprofen, or other minor pain relievers, such as acetaminophen, are often recommended.  HEAT AND COLD  Cold treatment (icing) relieves pain and reduces inflammation. Cold treatment should be applied for 10 to 15 minutes every 2 to 3 hours for inflammation and pain and immediately after any activity that aggravates your symptoms. Use ice packs or massage the area with a piece of ice (ice massage).  Heat treatment may be used prior to performing the stretching and strengthening activities prescribed by your caregiver, physical therapist, or athletic trainer. Use a heat pack or soak the injury in warm water. SEEK MEDICAL CARE IF:  Treatment seems to offer no benefit, or the condition worsens.  Any medications produce adverse side effects. EXERCISES  RANGE OF MOTION (ROM) AND STRETCHING EXERCISES - Quadriceps Contusion These exercises may help you when beginning to rehabilitate your injury. Your symptoms may resolve with or without further involvement from your physician, physical therapist or athletic trainer. While completing these exercises, remember:   Restoring tissue flexibility helps normal motion to return to the joints. This allows healthier, less  painful movement and activity.  An effective stretch  should be held for at least 30 seconds.  A stretch should never be painful. You should only feel a gentle lengthening or release in the stretched tissue. RANGE OF MOTION - Knee Flexion, Active  Lie on your back with both knees straight. (If this causes back discomfort, bend your opposite knee, placing your foot flat on the floor.)  Slowly slide your heel back toward your buttocks until you feel a gentle stretch in the front of your knee or thigh.  Hold for __________ seconds. Slowly slide your heel back to the starting position. Repeat __________ times. Complete this exercise __________ times per day.  STRETCH - Quadriceps, Prone   Lie on your stomach on a firm surface, such as a bed or padded floor.  Bend your right / left knee and grasp your ankle. If you are unable to reach, your ankle or pant leg, use a belt around your foot to lengthen your reach.  Gently pull your heel toward your buttocks. Your knee should not slide out to the side. You should feel a stretch in the front of your thigh and/or knee.  Hold this position for __________ seconds. Repeat __________ times. Complete this stretch __________ times per day.  STRENGTHENING EXERCISES - Quadriceps Contusion These exercises may help you when beginning to rehabilitate your injury. They may resolve your symptoms with or without further involvement from your physician, physical therapist or athletic trainer. While completing these exercises, remember:   Muscles can gain both the endurance and the strength needed for everyday activities through controlled exercises.  Complete these exercises as instructed by your physician, physical therapist or athletic trainer. Progress the resistance and repetitions only as guided. STRENGTH - Quadriceps, Isometrics  Lie on your back with your right / left leg extended and your opposite knee bent.  Gradually tense the muscles in the front of your right / left thigh. You should see either your  knee cap slide up toward your hip or increased dimpling just above the knee. This motion will push the back of the knee down toward the floor/mat/bed on which you are lying.  Hold the muscle as tight as you can without increasing your pain for __________ seconds.  Relax the muscles slowly and completely in between each repetition. Repeat __________ times. Complete this exercise __________ times per day.  STRENGTH - Quadriceps, Short Arcs   Lie on your back. Place a __________ inch towel roll under your knee so that the knee slightly bends.  Raise only your lower leg by tightening the muscles in the front of your thigh. Do not allow your thigh to rise.  Hold this position for __________ seconds. Repeat __________ times. Complete this exercise __________ times per day.  OPTIONAL ANKLE WEIGHTS: Begin with ____________________, but DO NOT exceed ____________________. Increase in 1 lb/0.5 kg increments.  STRENGTH - Quadriceps, Straight Leg Raises  Quality counts! Watch for signs that the quadriceps muscle is working to insure you are strengthening the correct muscles and not "cheating" by substituting with healthier muscles.  Lay on your back with your right / left leg extended and your opposite knee bent.  Tense the muscles in the front of your right / left thigh. You should see either your knee cap slide up or increased dimpling just above the knee. Your thigh may even quiver.  Tighten these muscles even more and raise your leg 4 to 6 inches off the floor. Hold for __________ seconds.  Keeping these muscles tense, lower your leg.  Relax the muscles slowly and completely in between each repetition. Repeat __________ times. Complete this exercise __________ times per day.  STRENGTH - Quadriceps, Wall Slides  Follow guidelines for form closely. Increased knee pain often results from poorly placed feet or knees.  Lean against a smooth wall or door and walk your feet out 18-24 inches. Place  your feet hip-width apart.  Slowly slide down the wall or door until your knees bend __________ degrees.* Keep your knees over your heels, not your toes, and in line with your hips, not falling to either side.  Hold for __________ seconds. Stand up to rest for __________ seconds in between each repetition. Repeat __________ times. Complete this exercise __________ times per day. * Your physician, physical therapist or athletic trainer will alter this angle based on your symptoms and progress.   This information is not intended to replace advice given to you by your health care provider. Make sure you discuss any questions you have with your health care provider.   Document Released: 12/12/2005 Document Revised: 04/28/2015 Document Reviewed: 03/26/2009 Elsevier Interactive Patient Education [employer].

## 2015-12-31 ENCOUNTER — Encounter: Payer: Self-pay | Admitting: Physician Assistant

## 2016-08-01 ENCOUNTER — Ambulatory Visit (INDEPENDENT_AMBULATORY_CARE_PROVIDER_SITE_OTHER): Payer: BLUE CROSS/BLUE SHIELD | Admitting: Nurse Practitioner

## 2016-08-01 ENCOUNTER — Encounter: Payer: Self-pay | Admitting: Nurse Practitioner

## 2016-08-01 VITALS — BP 156/96 | HR 57 | Ht 72.0 in | Wt 181.0 lb

## 2016-08-01 DIAGNOSIS — Z5181 Encounter for therapeutic drug level monitoring: Secondary | ICD-10-CM

## 2016-08-01 DIAGNOSIS — G40309 Generalized idiopathic epilepsy and epileptic syndromes, not intractable, without status epilepticus: Secondary | ICD-10-CM

## 2016-08-01 DIAGNOSIS — G40109 Localization-related (focal) (partial) symptomatic epilepsy and epileptic syndromes with simple partial seizures, not intractable, without status epilepticus: Secondary | ICD-10-CM

## 2016-08-01 MED ORDER — CARBAMAZEPINE 200 MG PO TABS: 400.0000 mg | ORAL_TABLET | Freq: Every day | ORAL | 3 refills | Status: DC

## 2016-08-01 NOTE — Patient Instructions (Addendum)
Will check labs today, CBC, CMP and CBZ level Renew medication (Epitol ) Call for any seizure activity Need to find a primary care provider blood pressure elevated in the office today Followup yearly and when necessary

## 2016-08-01 NOTE — Progress Notes (Signed)
[facility]  PATIENT: Shane Phillips DOB: [birth year]   REASON FOR VISIT:follow-up for seizure disorder HISTORY FROM:patient    HISTORY OF PRESENT ILLNESS:Shane Phillips, 60 year-old white male returns today for followup. He has a history of seizure disorder generalized, is currently well controlled on carbamazepine. He has been followed at [facility] since 1981. EEG has demonstrated slowing in the right hemisphere, onset of seizures occurred at age 70, presumptive of prenatal difficulty. His last seizure occurred in August of 2001 after missing several doses of his medication. He denies any difficulty tolerating his medications, no difficulty with daytime drowsiness, no falls, no feelings of being off balance. He's had no visual difficulties, he has rare headache. Patient does not have PCP. He returns for reevaluation. He needs refills on his medication and labs.   REVIEW OF SYSTEMS: Full 14 system review of systems performed and notable only for those listed, all others are neg:  Constitutional: neg  Cardiovascular: neg Ear/Nose/Throat: neg  Skin: neg Eyes: neg Respiratory: neg Gastroitestinal: neg  Hematology/Lymphatic: neg  Endocrine: neg Musculoskeletal:neg Allergy/Immunology: neg Neurological: neg Psychiatric: neg Sleep : neg   ALLERGIES: No Known Allergies  HOME MEDICATIONS: Outpatient Medications Prior to Visit  Medication Sig Dispense Refill  . carbamazepine (EPITOL) 200 MG tablet Take 2 tablets (400 mg total) by mouth at bedtime. 180 tablet 3   No facility-administered medications prior to visit.     PAST MEDICAL HISTORY: Past Medical History:  Diagnosis Date  . High cholesterol   . Hypertension   . Seizure ([place])     PAST SURGICAL HISTORY: Past Surgical History:  Procedure Laterality Date  . None      FAMILY HISTORY: Family History  Problem Relation Age of Onset  . Leukemia Mother   . Cancer Mother   . Heart attack Brother     2 in 6  months   . Hyperlipidemia Brother   . Cancer Sister     breast cancer  . Hyperlipidemia Sister   . Cancer Father     bladder cancer  . Cancer Maternal Grandfather     lung cancer  . Cancer Paternal Grandfather     melanoma  . Hyperlipidemia Sister     SOCIAL HISTORY: Social History   Social History  . Marital status: Married    Spouse name: [name]  . Number of children: N/A  . Years of education: N/A   Occupational History  . Self Empolyed    Social History Main Topics  . Smoking status: Never Smoker  . Smokeless tobacco: Never Used  . Alcohol use No  . Drug use: No  . Sexual activity: Yes   Other Topics Concern  . Not on file   Social History Narrative   Patient owns a [employer].   Patient is Married to [place].            PHYSICAL EXAM  Vitals:   08/01/16 1240  BP: (!) 156/96  Pulse: (!) 57  Weight: 181 lb (82.1 kg)  Height: 6' (1.829 m)   Body mass index is 24.55 kg/m. General: well developed, well nourished, seated, in no evident distress  Head: head normocephalic and atraumatic. Oropharynx benign  Neck: supple with no carotid bruits  Neurologic Exam  Mental Status: Awake and fully alert. Oriented to place and time. Follows all commands. Speech and language normal.  Cranial Nerves: Pupils equal, briskly reactive to light. Extraocular movements full without nystagmus. Visual fields full to confrontation. Hearing intact and  symmetric to finger snap. Facial sensation intact. Face, tongue, palate move normally and symmetrically. Neck flexion and extension normal.  Motor: Normal bulk and tone. Normal strength in all tested extremity muscles.No focal weakness  Coordination: Rapid alternating movements normal in all extremities. Finger-to-nose and heel-to-shin performed accurately bilaterally.  Gait and Station: Arises from chair without difficulty. Stance is normal. Gait demonstrates normal stride length and balance . Able to heel,  toe and tandem walk without difficulty.  Reflexes: 2+ and symmetric. Toes downgoing.   DIAGNOSTIC DATA (LABS, IMAGING, TESTING) - Lab Results  Component Value Date   CHOL 215 (H) 12/28/2015   HDL 76 12/28/2015   LDLCALC 126 12/28/2015   TRIG 64 12/28/2015   CHOLHDL 2.8 12/28/2015   ASSESSMENT AND PLAN  60 y.o. year old male  has a past medical history of Seizure;here to follow-up . Last seizure event occurred in 2001.   Will check labs today, CBC, CMP and CBZ level Renew medication (Epitol ) Call for any seizure activity Need to find a primary care provider, blood pressure elevated in the office today Followup yearly and when necessary [name], [name], [facility], [facility] [address], [place], [place] [address] [phone]

## 2016-08-02 ENCOUNTER — Telehealth: Payer: Self-pay | Admitting: *Deleted

## 2016-08-02 LAB — CBC WITH DIFFERENTIAL/PLATELET
Basophils Absolute: 0 10*3/uL (ref 0.0–0.2)
Basos: 0 %
EOS (ABSOLUTE): 0.1 10*3/uL (ref 0.0–0.4)
Eos: 1 %
Hematocrit: 40.7 % (ref 37.5–51.0)
Hemoglobin: 13.9 g/dL (ref 12.6–17.7)
Immature Grans (Abs): 0 10*3/uL (ref 0.0–0.1)
Immature Granulocytes: 0 %
Lymphocytes Absolute: 1.4 10*3/uL (ref 0.7–3.1)
Lymphs: 30 %
MCH: 30.7 pg (ref 26.6–33.0)
MCHC: 34.2 g/dL (ref 31.5–35.7)
MCV: 90 fL (ref 79–97)
Monocytes Absolute: 0.4 10*3/uL (ref 0.1–0.9)
Monocytes: 9 %
Neutrophils Absolute: 2.8 10*3/uL (ref 1.4–7.0)
Neutrophils: 60 %
Platelets: 206 10*3/uL (ref 150–379)
RBC: 4.53 x10E6/uL (ref 4.14–5.80)
RDW: 14 % (ref 12.3–15.4)
WBC: 4.6 10*3/uL (ref 3.4–10.8)

## 2016-08-02 LAB — COMPREHENSIVE METABOLIC PANEL
ALT: 23 IU/L (ref 0–44)
AST: 20 IU/L (ref 0–40)
Albumin/Globulin Ratio: 1.9 (ref 1.2–2.2)
Albumin: 4.6 g/dL (ref 3.5–5.5)
Alkaline Phosphatase: 81 IU/L (ref 39–117)
BUN/Creatinine Ratio: 19 (ref 9–20)
BUN: 15 mg/dL (ref 6–24)
Bilirubin Total: 0.4 mg/dL (ref 0.0–1.2)
CO2: 25 mmol/L (ref 18–29)
Calcium: 9.1 mg/dL (ref 8.7–10.2)
Chloride: 99 mmol/L (ref 96–106)
Creatinine, Ser: 0.79 mg/dL (ref 0.76–1.27)
GFR calc Af Amer: 114 mL/min/{1.73_m2} (ref >59)
GFR calc non Af Amer: 98 mL/min/{1.73_m2} (ref >59)
Globulin, Total: 2.4 g/dL (ref 1.5–4.5)
Glucose: 86 mg/dL (ref 65–99)
Potassium: 5 mmol/L (ref 3.5–5.2)
Sodium: 139 mmol/L (ref 134–144)
Total Protein: 7 g/dL (ref 6.0–8.5)

## 2016-08-02 LAB — CARBAMAZEPINE LEVEL, TOTAL: Carbamazepine (Tegretol), S: 6.7 ug/mL (ref 4.0–12.0)

## 2016-08-02 NOTE — Telephone Encounter (Signed)
-----   Message from [name], NP sent at 08/02/2016  7:53 AM EDT ----- Labs stable please call the patient

## 2016-08-02 NOTE — Telephone Encounter (Signed)
Spoke to wife (ok per DPR)  and let her know that her husbands lab results were stable.  She would relay to him.

## 2017-04-12 ENCOUNTER — Encounter: Payer: Self-pay | Admitting: Physician Assistant

## 2017-04-12 ENCOUNTER — Ambulatory Visit (INDEPENDENT_AMBULATORY_CARE_PROVIDER_SITE_OTHER): Payer: BLUE CROSS/BLUE SHIELD | Admitting: Physician Assistant

## 2017-04-12 VITALS — BP 138/74 | HR 56 | Temp 98.7°F | Resp 16 | Ht 71.0 in | Wt 177.0 lb

## 2017-04-12 DIAGNOSIS — Z13228 Encounter for screening for other metabolic disorders: Secondary | ICD-10-CM | POA: Diagnosis not present

## 2017-04-12 DIAGNOSIS — Z13220 Encounter for screening for lipoid disorders: Secondary | ICD-10-CM | POA: Diagnosis not present

## 2017-04-12 DIAGNOSIS — Z125 Encounter for screening for malignant neoplasm of prostate: Secondary | ICD-10-CM

## 2017-04-12 DIAGNOSIS — Z130 Encounter for screening for diseases of the blood and blood-forming organs and certain disorders involving the immune mechanism: Secondary | ICD-10-CM

## 2017-04-12 DIAGNOSIS — Z1389 Encounter for screening for other disorder: Secondary | ICD-10-CM

## 2017-04-12 DIAGNOSIS — Z0000 Encounter for general adult medical examination without abnormal findings: Secondary | ICD-10-CM | POA: Diagnosis not present

## 2017-04-12 DIAGNOSIS — N400 Enlarged prostate without lower urinary tract symptoms: Secondary | ICD-10-CM

## 2017-04-12 LAB — POCT URINALYSIS DIP (MANUAL ENTRY)
Bilirubin, UA: NEGATIVE
Blood, UA: NEGATIVE
Glucose, UA: NEGATIVE mg/dL
Ketones, POC UA: NEGATIVE mg/dL
Leukocytes, UA: NEGATIVE
Nitrite, UA: NEGATIVE
Protein Ur, POC: NEGATIVE mg/dL
Spec Grav, UA: 1.015 (ref 1.010–1.025)
Urobilinogen, UA: 0.2 E.U./dL
pH, UA: 7 (ref 5.0–8.0)

## 2017-04-12 NOTE — Patient Instructions (Addendum)
   IF you received an x-ray today, you will receive an invoice from [facility]. Please contact [facility] at [phone] with questions or concerns regarding your invoice.   IF you received labwork today, you will receive an invoice from [facility]. Please contact [facility] at [phone] with questions or concerns regarding your invoice.   Our billing staff will not be able to assist you with questions regarding bills from these companies.  You will be contacted with the lab results as soon as they are available. The fastest way to get your results is to activate your My Chart account. Instructions are located on the last page of this paperwork. If you have not heard from us regarding the results in 2 weeks, please contact this office.     Keeping you healthy  Get these tests  Blood pressure- Have your blood pressure checked once a year by your healthcare provider.  Normal blood pressure is 120/80  Weight- Have your body mass index (BMI) calculated to screen for obesity.  BMI is a measure of body fat based on height and weight. You can also calculate your own BMI at [email or web address].com/bmi/.  Cholesterol- Have your cholesterol checked every year.  Diabetes- Have your blood sugar checked regularly if you have high blood pressure, high cholesterol, have a family history of diabetes or if you are overweight.  Screening for Colon Cancer- Colonoscopy starting at age 50.  Screening may begin sooner depending on your family history and other health conditions. Follow up colonoscopy as directed by your Gastroenterologist.  Screening for Prostate Cancer- Both blood work (PSA) and a rectal exam help screen for Prostate Cancer.  Screening begins at age 40 with African-American men and at age 50 with Caucasian men.  Screening may begin sooner depending on your family history.  Take these medicines  Aspirin- One aspirin daily can help prevent Heart disease and Stroke.  Flu  shot- Every fall.  Tetanus- Every 10 years.  Zostavax- Once after the age of 60 to prevent Shingles.  Pneumonia shot- Once after the age of 65; if you are younger than 65, ask your healthcare provider if you need a Pneumonia shot.  Take these steps  Don't smoke- If you do smoke, talk to your doctor about quitting.  For tips on how to quit, go to www.smokefree.gov or call 1-800-QUIT-NOW.  Be physically active- Exercise 5 days a week for at least 30 minutes.  If you are not already physically active start slow and gradually work up to 30 minutes of moderate physical activity.  Examples of moderate activity include walking briskly, mowing the yard, dancing, swimming, bicycling, etc.  Eat a healthy diet- Eat a variety of healthy food such as fruits, vegetables, low fat milk, low fat cheese, yogurt, lean meant, poultry, fish, beans, tofu, etc. For more information go to www.thenutritionsource.org  Drink alcohol in moderation- Limit alcohol intake to less than two drinks a day. Never drink and drive.  Dentist- Brush and floss twice daily; visit your dentist twice a year.  Depression- Your emotional health is as important as your physical health. If you're feeling down, or losing interest in things you would normally enjoy please talk to your healthcare provider.  Eye exam- Visit your eye doctor every year.  Safe sex- If you may be exposed to a sexually transmitted infection, use a condom.  Seat belts- Seat belts can save your life; always wear one.  Smoke/Carbon Monoxide detectors- These detectors need to be installed on the appropriate   level of your home.  Replace batteries at least once a year.  Skin cancer- When out in the sun, cover up and use sunscreen 15 SPF or higher.  Violence- If anyone is threatening you, please tell your healthcare provider.  Living Will/ Health care power of attorney- Speak with your healthcare provider and family. 

## 2017-04-12 NOTE — Progress Notes (Signed)
PRIMARY CARE AT [facility] [address], [place] [address] [phone]  Date:  04/12/2017   Name:  Shane Phillips   DOB:  [birth year]   MRN:  [id number]  PCP:  Trena Platt, PA    History of Present Illness:  Shane Phillips is a 61 y.o. male patient who presents to PCP with  Chief Complaint  Patient presents with  . Annual Exam     DIET: Cong-mai red yeast rice.  He has been trying to watch his food intake.  [name] blood pressure supplement also taken to drive down blood rpessure.   BM: NORMAL. Qd.  No blood or black stool.    URINATION: he can have some pain at his suprapubic area as if he is holding his urine for awhile, but this dissipates.  Sensation will last for a day, then go away.  Nothing known to relieve the sensation.  This had happened months ago.    SLEEP: FINE.  5-8 hours per night dependent upon work schedule.  SOCIAL ACTIVITY: project of house.  Wife and him walk the neighborhood.    2001 was last epileptic seizure, he reports.  Missed dose.    etOH: 6 pak over 6 month period Tobacco or vaping: none Illicit drug use: none  Patient Active Problem List   Diagnosis Date Noted  . Encounter for therapeutic drug monitoring 08/01/2016  . Localization-related focal epilepsy with simple partial seizures (HCC) 08/05/2013  . Generalized convulsive epilepsy (HCC) 08/05/2013    Past Medical History:  Diagnosis Date  . High cholesterol   . Hypertension   . Seizure [facility])     Past Surgical History:  Procedure Laterality Date  . None      Social History  Substance Use Topics  . Smoking status: Never Smoker  . Smokeless tobacco: Never Used  . Alcohol use No    Family History  Problem Relation Age of Onset  . Leukemia Mother   . Cancer Mother   . Heart attack Brother     2 in 6 months   . Hyperlipidemia Brother   . Cancer Sister     breast cancer  . Hyperlipidemia Sister   . Cancer Father     bladder cancer  . Cancer Maternal Grandfather    lung cancer  . Cancer Paternal Grandfather     melanoma  . Hyperlipidemia Sister     No Known Allergies  Medication list has been reviewed and updated.  Current Outpatient Prescriptions on File Prior to Visit  Medication Sig Dispense Refill  . carbamazepine (EPITOL) 200 MG tablet Take 2 tablets (400 mg total) by mouth at bedtime. 180 tablet 3   No current facility-administered medications on file prior to visit.     ROS ROS otherwise unremarkable unless listed above.  Physical Examination: BP 138/74 (BP Location: Right Arm, Patient Position: Sitting, Cuff Size: Large)   Pulse (!) 56   Temp 98.7 F (37.1 C) (Oral)   Resp 16   Ht 5\' 11"  (1.803 m)   Wt 177 lb (80.3 kg)   SpO2 99%   BMI 24.69 kg/m  Ideal Body Weight: Weight in (lb) to have BMI = 25: 178.9  Physical Exam  Constitutional: He is oriented to person, place, and time. He appears well-developed and well-nourished. No distress.  HENT:  Head: Normocephalic and atraumatic.  Eyes: Conjunctivae and EOM are normal. Pupils are equal, round, and reactive to light.  Cardiovascular: Normal rate and regular rhythm.  Exam reveals no friction  rub.   No murmur heard. Pulmonary/Chest: Effort normal and breath sounds normal. No respiratory distress.  Abdominal: Hernia confirmed negative in the right inguinal area and confirmed negative in the left inguinal area.  Genitourinary: Testes normal. Prostate is enlarged. Prostate is not tender.  Neurological: He is alert and oriented to person, place, and time.  Skin: Skin is warm and dry. He is not diaphoretic.  Psychiatric: He has a normal mood and affect. His behavior is normal.     Assessment and Plan: Tu Bayle is a 61 y.o. male who is here today for cc of annual physical exam. Annual physical exam - Plan: CBC, CMP14+EGFR, PSA, Lipid panel, POCT urinalysis dipstick, CANCELED: POCT urinalysis dipstick  Screening for deficiency anemia - Plan: CBC  Screening for metabolic  disorder - Plan: CMP14+EGFR  Screening for prostate cancer - Plan: PSA  Screening for lipid disorders - Plan: Lipid panel  Screening for hematuria or proteinuria - Plan: POCT urinalysis dipstick, CANCELED: POCT urinalysis dipstick  Prostate enlargement - Plan: POCT urinalysis dipstick  Ivar Drape, PA-C [facility] 4/19/20187:37 AM

## 2017-04-13 LAB — CBC
Hematocrit: 41.1 % (ref 37.5–51.0)
Hemoglobin: 13.9 g/dL (ref 13.0–17.7)
MCH: 30.9 pg (ref 26.6–33.0)
MCHC: 33.8 g/dL (ref 31.5–35.7)
MCV: 91 fL (ref 79–97)
Platelets: 239 10*3/uL (ref 150–379)
RBC: 4.5 x10E6/uL (ref 4.14–5.80)
RDW: 13.6 % (ref 12.3–15.4)
WBC: 4.4 10*3/uL (ref 3.4–10.8)

## 2017-04-13 LAB — CMP14+EGFR
ALT: 23 IU/L (ref 0–44)
AST: 20 IU/L (ref 0–40)
Albumin/Globulin Ratio: 1.8 (ref 1.2–2.2)
Albumin: 4.6 g/dL (ref 3.6–4.8)
Alkaline Phosphatase: 85 IU/L (ref 39–117)
BUN/Creatinine Ratio: 22 (ref 10–24)
BUN: 16 mg/dL (ref 8–27)
Bilirubin Total: 0.4 mg/dL (ref 0.0–1.2)
CO2: 27 mmol/L (ref 18–29)
Calcium: 9.3 mg/dL (ref 8.6–10.2)
Chloride: 103 mmol/L (ref 96–106)
Creatinine, Ser: 0.74 mg/dL — ABNORMAL LOW (ref 0.76–1.27)
GFR calc Af Amer: 116 mL/min/{1.73_m2} (ref >59)
GFR calc non Af Amer: 100 mL/min/{1.73_m2} (ref >59)
Globulin, Total: 2.5 g/dL (ref 1.5–4.5)
Glucose: 89 mg/dL (ref 65–99)
Potassium: 4.1 mmol/L (ref 3.5–5.2)
Sodium: 141 mmol/L (ref 134–144)
Total Protein: 7.1 g/dL (ref 6.0–8.5)

## 2017-04-13 LAB — LIPID PANEL
Chol/HDL Ratio: 2.8 ratio (ref 0.0–5.0)
Cholesterol, Total: 236 mg/dL — ABNORMAL HIGH (ref 100–199)
HDL: 83 mg/dL (ref >39)
LDL Calculated: 140 mg/dL — ABNORMAL HIGH (ref 0–99)
Triglycerides: 64 mg/dL (ref 0–149)
VLDL Cholesterol Cal: 13 mg/dL (ref 5–40)

## 2017-04-13 LAB — PSA: Prostate Specific Ag, Serum: 2.9 ng/mL (ref 0.0–4.0)

## 2017-04-19 ENCOUNTER — Ambulatory Visit (INDEPENDENT_AMBULATORY_CARE_PROVIDER_SITE_OTHER): Payer: BLUE CROSS/BLUE SHIELD

## 2017-04-19 ENCOUNTER — Encounter: Payer: Self-pay | Admitting: Physician Assistant

## 2017-04-19 ENCOUNTER — Ambulatory Visit (INDEPENDENT_AMBULATORY_CARE_PROVIDER_SITE_OTHER): Payer: BLUE CROSS/BLUE SHIELD | Admitting: Physician Assistant

## 2017-04-19 VITALS — BP 147/93 | HR 64 | Temp 98.4°F | Resp 18 | Ht 71.0 in | Wt 180.0 lb

## 2017-04-19 DIAGNOSIS — M79671 Pain in right foot: Secondary | ICD-10-CM

## 2017-04-19 DIAGNOSIS — M25511 Pain in right shoulder: Secondary | ICD-10-CM

## 2017-04-19 DIAGNOSIS — G8929 Other chronic pain: Secondary | ICD-10-CM

## 2017-04-19 MED ORDER — MELOXICAM 15 MG PO TABS: 15.0000 mg | ORAL_TABLET | Freq: Every day | ORAL | 1 refills | Status: DC

## 2017-04-19 MED ORDER — CYCLOBENZAPRINE HCL 10 MG PO TABS: 5.0000 mg | ORAL_TABLET | Freq: Three times a day (TID) | ORAL | 0 refills | Status: DC | PRN

## 2017-04-19 NOTE — Patient Instructions (Addendum)
Please ice the foot and shoulder three times per day for 15 minutes.   Use the tarsal cookie in the shoe.  If your symptoms do not improve within 2 weeks, I need you to contact me and I will contact podiatry for consult. Shoulder pain we are using both the mobic and muscle relaxant.  Note the flexeril can make you feel sedated.  Do not operate heavy machinery with this medication.   If this does not improve over the next 2-4 weeks, an injection may be the next steroid, to bring the inflammation down in the shoulder.  Shoulder Exercises Ask your health care provider which exercises are safe for you. Do exercises exactly as told by your health care provider and adjust them as directed. It is normal to feel mild stretching, pulling, tightness, or discomfort as you do these exercises, but you should stop right away if you feel sudden pain or your pain gets worse.Do not begin these exercises until told by your health care provider. RANGE OF MOTION EXERCISES  These exercises warm up your muscles and joints and improve the movement and flexibility of your shoulder. These exercises also help to relieve pain, numbness, and tingling. These exercises involve stretching your injured shoulder directly. Exercise A: Pendulum   1. Stand near a wall or a surface that you can hold onto for balance. 2. Bend at the waist and let your left / right arm hang straight down. Use your other arm to support you. Keep your back straight and do not lock your knees. 3. Relax your left / right arm and shoulder muscles, and move your hips and your trunk so your left / right arm swings freely. Your arm should swing because of the motion of your body, not because you are using your arm or shoulder muscles. 4. Keep moving your body so your arm swings in the following directions, as told by your health care provider:  Side to side.  Forward and backward.  In clockwise and counterclockwise circles. 5. Continue each motion for  __________ seconds, or for as long as told by your health care provider. 6. Slowly return to the starting position. Repeat __________ times. Complete this exercise __________ times a day. Exercise B:Flexion, Standing   1. Stand and hold a broomstick, a cane, or a similar object. Place your hands a little more than shoulder-width apart on the object. Your left / right hand should be palm-up, and your other hand should be palm-down. 2. Keep your elbow straight and keep your shoulder muscles relaxed. Push the stick down with your healthy arm to raise your left / right arm in front of your body, and then over your head until you feel a stretch in your shoulder.  Avoid shrugging your shoulder while you raise your arm. Keep your shoulder blade tucked down toward the middle of your back. 3. Hold for __________ seconds. 4. Slowly return to the starting position. Repeat __________ times. Complete this exercise __________ times a day. Exercise C: Abduction, Standing  1. Stand and hold a broomstick, a cane, or a similar object. Place your hands a little more than shoulder-width apart on the object. Your left / right hand should be palm-up, and your other hand should be palm-down. 2. While keeping your elbow straight and your shoulder muscles relaxed, push the stick across your body toward your left / right side. Raise your left / right arm to the side of your body and then over your head until you feel  a stretch in your shoulder.  Do not raise your arm above shoulder height, unless your health care provider tells you to do that.  Avoid shrugging your shoulder while you raise your arm. Keep your shoulder blade tucked down toward the middle of your back. 3. Hold for __________ seconds. 4. Slowly return to the starting position. Repeat __________ times. Complete this exercise __________ times a day. Exercise D:Internal Rotation   1. Place your left / right hand behind your back, palm-up. 2. Use your  other hand to dangle an exercise band, a towel, or a similar object over your shoulder. Grasp the band with your left / right hand so you are holding onto both ends. 3. Gently pull up on the band until you feel a stretch in the front of your left / right shoulder.  Avoid shrugging your shoulder while you raise your arm. Keep your shoulder blade tucked down toward the middle of your back. 4. Hold for __________ seconds. 5. Release the stretch by letting go of the band and lowering your hands. Repeat __________ times. Complete this exercise __________ times a day. STRETCHING EXERCISES  These exercises warm up your muscles and joints and improve the movement and flexibility of your shoulder. These exercises also help to relieve pain, numbness, and tingling. These exercises are done using your healthy shoulder to help stretch the muscles of your injured shoulder. Exercise E: Warehouse manager (External Rotation and Abduction)   1. Stand in a doorway with one of your feet slightly in front of the other. This is called a staggered stance. If you cannot reach your forearms to the door frame, stand facing a corner of a room. 2. Choose one of the following positions as told by your health care provider:  Place your hands and forearms on the door frame above your head.  Place your hands and forearms on the door frame at the height of your head.  Place your hands on the door frame at the height of your elbows. 3. Slowly move your weight onto your front foot until you feel a stretch across your chest and in the front of your shoulders. Keep your head and chest upright and keep your abdominal muscles tight. 4. Hold for __________ seconds. 5. To release the stretch, shift your weight to your back foot. Repeat __________ times. Complete this stretch __________ times a day. Exercise F:Extension, Standing  1. Stand and hold a broomstick, a cane, or a similar object behind your back.  Your hands should be a  little wider than shoulder-width apart.  Your palms should face away from your back. 2. Keeping your elbows straight and keeping your shoulder muscles relaxed, move the stick away from your body until you feel a stretch in your shoulder.  Avoid shrugging your shoulders while you move the stick. Keep your shoulder blade tucked down toward the middle of your back. 3. Hold for __________ seconds. 4. Slowly return to the starting position. Repeat __________ times. Complete this exercise __________ times a day. STRENGTHENING EXERCISES  These exercises build strength and endurance in your shoulder. Endurance is the ability to use your muscles for a long time, even after they get tired. Exercise G:External Rotation   1. Sit in a stable chair without armrests. 2. Secure an exercise band at elbow height on your left / right side. 3. Place a soft object, such as a folded towel or a small pillow, between your left / right upper arm and your body to move  your elbow a few inches away (about 10 cm) from your side. 4. Hold the end of the band so it is tight and there is no slack. 5. Keeping your elbow pressed against the soft object, move your left / right forearm out, away from your abdomen. Keep your body steady so only your forearm moves. 6. Hold for __________ seconds. 7. Slowly return to the starting position. Repeat __________ times. Complete this exercise __________ times a day. Exercise H:Shoulder Abduction   1. Sit in a stable chair without armrests, or stand. 2. Hold a __________ weight in your left / right hand, or hold an exercise band with both hands. 3. Start with your arms straight down and your left / right palm facing in, toward your body. 4. Slowly lift your left / right hand out to your side. Do not lift your hand above shoulder height unless your health care provider tells you that this is safe.  Keep your arms straight.  Avoid shrugging your shoulder while you do this movement.  Keep your shoulder blade tucked down toward the middle of your back. 5. Hold for __________ seconds. 6. Slowly lower your arm, and return to the starting position. Repeat __________ times. Complete this exercise __________ times a day. Exercise I:Shoulder Extension  1. Sit in a stable chair without armrests, or stand. 2. Secure an exercise band to a stable object in front of you where it is at shoulder height. 3. Hold one end of the exercise band in each hand. Your palms should face each other. 4. Straighten your elbows and lift your hands up to shoulder height. 5. Step back, away from the secured end of the exercise band, until the band is tight and there is no slack. 6. Squeeze your shoulder blades together as you pull your hands down to the sides of your thighs. Stop when your hands are straight down by your sides. Do not let your hands go behind your body. 7. Hold for __________ seconds. 8. Slowly return to the starting position. Repeat __________ times. Complete this exercise __________ times a day. Exercise J:Standing Shoulder Row  1. Sit in a stable chair without armrests, or stand. 2. Secure an exercise band to a stable object in front of you so it is at waist height. 3. Hold one end of the exercise band in each hand. Your palms should be in a thumbs-up position. 4. Bend each of your elbows to an "L" shape (about 90 degrees) and keep your upper arms at your sides. 5. Step back until the band is tight and there is no slack. 6. Slowly pull your elbows back behind you. 7. Hold for __________ seconds. 8. Slowly return to the starting position. Repeat __________ times. Complete this exercise __________ times a day. Exercise K:Shoulder Press-Ups   1. Sit in a stable chair that has armrests. Sit upright, with your feet flat on the floor. 2. Put your hands on the armrests so your elbows are bent and your fingers are pointing forward. Your hands should be about even with the sides of  your body. 3. Push down on the armrests and use your arms to lift yourself off of the chair. Straighten your elbows and lift yourself up as much as you comfortably can.  Move your shoulder blades down, and avoid letting your shoulders move up toward your ears.  Keep your feet on the ground. As you get stronger, your feet should support less of your body weight as you lift yourself  up. 4. Hold for __________ seconds. 5. Slowly lower yourself back into the chair. Repeat __________ times. Complete this exercise __________ times a day. Exercise L: Wall Push-Ups   1. Stand so you are facing a stable wall. Your feet should be about one arm-length away from the wall. 2. Lean forward and place your palms on the wall at shoulder height. 3. Keep your feet flat on the floor as you bend your elbows and lean forward toward the wall. 4. Hold for __________ seconds. 5. Straighten your elbows to push yourself back to the starting position. Repeat __________ times. Complete this exercise __________ times a day. This information is not intended to replace advice given to you by your health care provider. Make sure you discuss any questions you have with your health care provider. Document Released: 10/26/2005 Document Revised: 09/05/2016 Document Reviewed: 08/23/2015 Elsevier Interactive Patient Education  2017 [employer].   IF you received an x-ray today, you will receive an invoice from [facility]. Please contact [facility] at [phone] with questions or concerns regarding your invoice.   IF you received labwork today, you will receive an invoice from [facility]. Please contact [facility] at [phone] with questions or concerns regarding your invoice.   Our billing staff will not be able to assist you with questions regarding bills from these companies.  You will be contacted with the lab results as soon as they are available. The fastest way to get your results is to activate  your My Chart account. Instructions are located on the last page of this paperwork. If you have not heard from [place] regarding the results in 2 weeks, please contact this office.

## 2017-04-19 NOTE — Progress Notes (Signed)
PRIMARY CARE AT [facility] [address] [place] [address] [phone]  Date:  04/19/2017   Name:  Shane Phillips   DOB:  Nov 05, 1956   MRN:  [id number]  PCP:  Ivar Drape, PA    History of Present Illness:  Shane Phillips is a 61 y.o. male patient who presents to PCP with  Chief Complaint  Patient presents with  . Shoulder Pain    shoulder injury     Shoulder pain: right shoulder pain started  He tried to pop the garage door in overhead a couple years ago, which the pain started at the back of the shoulder.  Hurts to overhead reach.  Appears to have worsenening over hthe last few months.  No swelling.  No numbness or tingling down the arm.  Occasional neck pain.  He can feel like the nerves are radiating down the shoulder to the deltoid.    Ball of foot is hurting: 2-3 months ago, his symptoms initiated with the ball of his foot hurting.  Morning is fine, but by a few hours, the pain is worsened.  No swelling.  He has the sensation that his socks are balled up under his foot.  No hx of injury.  He heats the foot, which has improved.  He has tried icy hot which can improve the pain temporarily.   He recalls stomping his foot against a floor board, and the symptoms started.    Patient Active Problem List   Diagnosis Date Noted  . Encounter for therapeutic drug monitoring 08/01/2016  . Localization-related focal epilepsy with simple partial seizures ([place]) 08/05/2013  . Generalized convulsive epilepsy ([place]) 08/05/2013    Past Medical History:  Diagnosis Date  . High cholesterol   . Hypertension   . Seizure [facility])     Past Surgical History:  Procedure Laterality Date  . None      Social History  Substance Use Topics  . Smoking status: Never Smoker  . Smokeless tobacco: Never Used  . Alcohol use No    Family History  Problem Relation Age of Onset  . Leukemia Mother   . Cancer Mother   . Heart attack Brother     2 in 6 months   . Hyperlipidemia Brother   . Cancer  Sister     breast cancer  . Hyperlipidemia Sister   . Cancer Father     bladder cancer  . Cancer Maternal Grandfather     lung cancer  . Cancer Paternal Grandfather     melanoma  . Hyperlipidemia Sister     No Known Allergies  Medication list has been reviewed and updated.  Current Outpatient Prescriptions on File Prior to Visit  Medication Sig Dispense Refill  . carbamazepine (EPITOL) 200 MG tablet Take 2 tablets (400 mg total) by mouth at bedtime. 180 tablet 3   No current facility-administered medications on file prior to visit.     ROS ROS otherwise unremarkable unless listed above.  Physical Examination: BP (!) 147/93   Pulse 64   Temp 98.4 F (36.9 C) (Oral)   Resp 18   Ht 5\' 11"  (1.803 m)   Wt 180 lb (81.6 kg)   SpO2 96%   BMI 25.10 kg/m  Ideal Body Weight: Weight in (lb) to have BMI = 25: 178.9  Physical Exam  Constitutional: He is oriented to person, place, and time. He appears well-developed and well-nourished. No distress.  HENT:  Head: Normocephalic and atraumatic.  Eyes: Conjunctivae and EOM are  normal. Pupils are equal, round, and reactive to light.  Cardiovascular: Normal rate and regular rhythm.  Exam reveals no friction rub.   No murmur heard. Pulmonary/Chest: Effort normal. No respiratory distress.  Musculoskeletal:       Right shoulder: He exhibits tenderness (tendere along lower trapezius.) and bony tenderness. He exhibits normal range of motion and no swelling.  Right foot tenderness along the 2nd and 3rd base of metatarsal.  Bony prominence appreciated at the 2nd which is tender.  No erythema or foreign body appearing.    Right shoulder: normal rom.  Pain incited with external rotation.  Negative hawkins, neer's Normal empty can test.   Neurological: He is alert and oriented to person, place, and time.  Skin: Skin is warm and dry. He is not diaphoretic.  Psychiatric: He has a normal mood and affect. His behavior is normal.   Dg Shoulder  Right  Result Date: 04/19/2017 CLINICAL DATA:  Right shoulder pain for 1 year EXAM: RIGHT SHOULDER - 2+ VIEW COMPARISON:  None. FINDINGS: No acute fracture. No dislocation. Unremarkable soft tissues. Mild degenerative change of the [facility] joint IMPRESSION: No acute bony pathology. Electronically Signed   By: Marybelle Killings M.D.   On: 04/19/2017 11:03   Dg Foot Complete Right  Result Date: 04/19/2017 CLINICAL DATA:  Right foot pain. Prominent bony nodule. No reported injury . EXAM: RIGHT FOOT COMPLETE - 3+ VIEW COMPARISON:  No prior . FINDINGS: Mild degenerative change. No acute bony or joint abnormality identified. No evidence of fracture dislocation. IMPRESSION: Mild diffuse degenerative change.  No acute abnormality. Electronically Signed   By: Marcello Moores  Register   On: 04/19/2017 11:05    Assessment and Plan: Shane Phillips is a 61 y.o. male who is here today for cc of right shoulder and right foot pain.   Foot possibly consistent pressure along the 2nd after repeated trauma with the foot, and burden with high instep.  He was given a tarsal cookie.  Plantar fasciitis insole removed, as this has been resolved, and may be placing added pressure to the forefoot. Advised icing, and given mobic. Shoulder may be rotator cuff strain, possible ac joint inflammation.  Flexeril and the added benefit of the anti-inflammatory.  He will return in 2 weeks if his symptoms do not improve.  We will consider shoulder injection at that time.  Chronic right shoulder pain - Plan: DG Shoulder Right, meloxicam (MOBIC) 15 MG tablet, cyclobenzaprine (FLEXERIL) 10 MG tablet  Right foot pain - Plan: DG Foot Complete Right, meloxicam (MOBIC) 15 MG tablet  Ivar Drape, PA-C [facility] 4/27/201810:05 AM

## 2017-04-20 ENCOUNTER — Encounter: Payer: Self-pay | Admitting: Physician Assistant

## 2017-07-26 NOTE — Progress Notes (Deleted)
[facility]  PATIENT: Shane Phillips DOB: Mar 28, 1956   REASON FOR VISIT:follow-up for seizure disorder HISTORY FROM:patient    HISTORY OF PRESENT ILLNESS:[name], 61 year-old white male returns today for followup. He has a history of seizure disorder generalized, is currently well controlled on carbamazepine. He has been followed at [facility] since [facility]. EEG has demonstrated slowing in the right hemisphere, onset of seizures occurred at age 77, presumptive of prenatal difficulty. His last seizure occurred in August of 2001 after missing several doses of his medication. He denies any difficulty tolerating his medications, no difficulty with daytime drowsiness, no falls, no feelings of being off balance. He's had no visual difficulties, he has rare headache. Patient does not have PCP. He returns for reevaluation. He needs refills on his medication and labs.   REVIEW OF SYSTEMS: Full 14 system review of systems performed and notable only for those listed, all others are neg:  Constitutional: neg  Cardiovascular: neg Ear/Nose/Throat: neg  Skin: neg Eyes: neg Respiratory: neg Gastroitestinal: neg  Hematology/Lymphatic: neg  Endocrine: neg Musculoskeletal:neg Allergy/Immunology: neg Neurological: neg Psychiatric: neg Sleep : neg   ALLERGIES: No Known Allergies  HOME MEDICATIONS: Outpatient Medications Prior to Visit  Medication Sig Dispense Refill  . carbamazepine (EPITOL) 200 MG tablet Take 2 tablets (400 mg total) by mouth at bedtime. 180 tablet 3  . cyclobenzaprine (FLEXERIL) 10 MG tablet Take 0.5-1 tablets (5-10 mg total) by mouth 3 (three) times daily as needed for muscle spasms. 30 tablet 0  . meloxicam (MOBIC) 15 MG tablet Take 1 tablet (15 mg total) by mouth daily. 30 tablet 1   No facility-administered medications prior to visit.     PAST MEDICAL HISTORY: Past Medical History:  Diagnosis Date  . High cholesterol   . Hypertension   . Seizure ([place])      PAST SURGICAL HISTORY: Past Surgical History:  Procedure Laterality Date  . None      FAMILY HISTORY: Family History  Problem Relation Age of Onset  . Leukemia Mother   . Cancer Mother   . Heart attack Brother        2 in 6 months   . Hyperlipidemia Brother   . Cancer Sister        breast cancer  . Hyperlipidemia Sister   . Cancer Father        bladder cancer  . Cancer Maternal Grandfather        lung cancer  . Cancer Paternal Grandfather        melanoma  . Hyperlipidemia Sister     SOCIAL HISTORY: Social History   Social History  . Marital status: Married    Spouse name: Shane Phillips  . Number of children: N/A  . Years of education: N/A   Occupational History  . Self Empolyed    Social History Main Topics  . Smoking status: Never Smoker  . Smokeless tobacco: Never Used  . Alcohol use No  . Drug use: No  . Sexual activity: Yes   Other Topics Concern  . Not on file   Social History Narrative   Patient owns a [employer].   Patient is Married to Shane Phillips.            PHYSICAL EXAM  There were no vitals filed for this visit. There is no height or weight on file to calculate BMI. General: well developed, well nourished, seated, in no evident distress  Head: head normocephalic and atraumatic. Oropharynx benign  Neck: supple  with no carotid bruits  Neurologic Exam  Mental Status: Awake and fully alert. Oriented to place and time. Follows all commands. Speech and language normal.  Cranial Nerves: Pupils equal, briskly reactive to light. Extraocular movements full without nystagmus. Visual fields full to confrontation. Hearing intact and symmetric to finger snap. Facial sensation intact. Face, tongue, palate move normally and symmetrically. Neck flexion and extension normal.  Motor: Normal bulk and tone. Normal strength in all tested extremity muscles.No focal weakness  Coordination: Rapid alternating movements normal in all extremities.  Finger-to-nose and heel-to-shin performed accurately bilaterally.  Gait and Station: Arises from chair without difficulty. Stance is normal. Gait demonstrates normal stride length and balance . Able to heel, toe and tandem walk without difficulty.  Reflexes: 2+ and symmetric. Toes downgoing.   DIAGNOSTIC DATA (LABS, IMAGING, TESTING) - Lab Results  Component Value Date   CHOL 236 (H) 04/12/2017   HDL 83 04/12/2017   LDLCALC 140 (H) 04/12/2017   TRIG 64 04/12/2017   CHOLHDL 2.8 04/12/2017   ASSESSMENT AND PLAN  61 y.o. year old male  has a past medical history of Seizure;here to follow-up . Last seizure event occurred in 2001.   Will check labs today, CBC, CMP and CBZ level Renew medication (Epitol ) Call for any seizure activity Need to find a primary care provider, blood pressure elevated in the office today Followup yearly and when necessary [name], [facility], [facility], [facility] [address] [place], [place] [address] [phone]

## 2017-07-27 ENCOUNTER — Ambulatory Visit: Payer: BLUE CROSS/BLUE SHIELD | Admitting: Nurse Practitioner

## 2017-07-27 NOTE — Progress Notes (Signed)
[facility]  PATIENT: Shane Phillips DOB: January 07, 1956   REASON FOR VISIT:follow-up for seizure disorder HISTORY FROM:patient    HISTORY OF PRESENT ILLNESS:Mr Walkup, 61 year-old white male returns today for followup. He has a history of seizure disorder generalized, is currently well controlled on carbamazepine. He has been followed at [facility] since 1981. EEG has demonstrated slowing in the right hemisphere, onset of seizures occurred at age 17, presumptive of prenatal difficulty. His last seizure occurred in August of 2001 after missing several doses of his medication. He denies any difficulty tolerating his medications, no difficulty with daytime drowsiness, no falls, no feelings of being off balance. He's had no visual difficulties, he has rare headache. Patient now has a PCP . Recent labs CBC and CMP were reviewed He returns for reevaluation. He needs refills on his medication   REVIEW OF SYSTEMS: Full 14 system review of systems performed and notable only for those listed, all others are neg:  Constitutional: neg  Cardiovascular: neg Ear/Nose/Throat: neg  Skin: neg Eyes: neg Respiratory: neg Gastroitestinal: neg  Hematology/Lymphatic: neg  Endocrine: neg Musculoskeletal:neg Allergy/Immunology: neg Neurological: History of seizure disorder Psychiatric: neg Sleep : neg   ALLERGIES: No Known Allergies  HOME MEDICATIONS: Outpatient Medications Prior to Visit  Medication Sig Dispense Refill  . carbamazepine (EPITOL) 200 MG tablet Take 2 tablets (400 mg total) by mouth at bedtime. 180 tablet 3  . cyclobenzaprine (FLEXERIL) 10 MG tablet Take 0.5-1 tablets (5-10 mg total) by mouth 3 (three) times daily as needed for muscle spasms. 30 tablet 0  . meloxicam (MOBIC) 15 MG tablet Take 1 tablet (15 mg total) by mouth daily. 30 tablet 1   No facility-administered medications prior to visit.     PAST MEDICAL HISTORY: Past Medical History:  Diagnosis Date  . High  cholesterol   . Hypertension   . Seizure ([place])    last sz 2001    PAST SURGICAL HISTORY: Past Surgical History:  Procedure Laterality Date  . None      FAMILY HISTORY: Family History  Problem Relation Age of Onset  . Leukemia Mother   . Cancer Mother   . Heart attack Brother        2 in 6 months   . Hyperlipidemia Brother   . Cancer Sister        breast cancer  . Hyperlipidemia Sister   . Cancer Father        bladder cancer  . Cancer Maternal Grandfather        lung cancer  . Cancer Paternal Grandfather        melanoma  . Hyperlipidemia Sister     SOCIAL HISTORY: Social History   Social History  . Marital status: Married    Spouse name: [name]  . Number of children: N/A  . Years of education: N/A   Occupational History  . Self Empolyed    Social History Main Topics  . Smoking status: Never Smoker  . Smokeless tobacco: Never Used  . Alcohol use No  . Drug use: No  . Sexual activity: Yes   Other Topics Concern  . 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   Patient is Married to [place].            PHYSICAL EXAM  Vitals:   07/28/17 1105  BP: (!) 160/89  Pulse: (!) 59  Weight: 181 lb 12.8 oz (82.5 kg)   Body mass index  is 25.36 kg/m. General: well developed, well nourished, seated, in no evident distress  Head: head normocephalic and atraumatic. Oropharynx benign  Neck: supple  Neurologic Exam  Mental Status: Awake and fully alert. Oriented to place and time. Follows all commands. Speech and language normal.  Cranial Nerves: Pupils equal, briskly reactive to light. Extraocular movements full without nystagmus. Visual fields full to confrontation. Hearing intact and symmetric to finger snap. Facial sensation intact. Face, tongue, palate move normally and symmetrically. Neck flexion and extension normal.  Motor: Normal bulk and tone. Normal strength in all tested extremity muscles.No focal weakness   Coordination: Rapid alternating movements normal in all extremities. Finger-to-nose and heel-to-shin performed accurately bilaterally.  Gait and Station: Arises from chair without difficulty. Stance is normal. Gait demonstrates normal stride length and balance . Able to heel, toe and tandem walk without difficulty.  Reflexes: 2+ and symmetric. Toes downgoing.   DIAGNOSTIC DATA (LABS, IMAGING, TESTING) - Lab Results  Component Value Date   CHOL 236 (H) 04/12/2017   HDL 83 04/12/2017   LDLCALC 140 (H) 04/12/2017   TRIG 64 04/12/2017   CHOLHDL 2.8 04/12/2017   ASSESSMENT AND PLAN  61 y.o. year old male  has a past medical history of Seizure;here to follow-up . Last seizure event occurred in 2001.   Recent labs reviewed from 04/12/17, CBC CMP within normal limits Renew medication (Epitol ) Call for any seizure activity Followup yearly and when necessary [name], [facility], [facility], [facility] [address] [place], [place] [address] [phone]

## 2017-07-28 ENCOUNTER — Encounter: Payer: Self-pay | Admitting: Nurse Practitioner

## 2017-07-28 ENCOUNTER — Ambulatory Visit (INDEPENDENT_AMBULATORY_CARE_PROVIDER_SITE_OTHER): Payer: BLUE CROSS/BLUE SHIELD | Admitting: Nurse Practitioner

## 2017-07-28 VITALS — BP 160/89 | HR 59 | Wt 181.8 lb

## 2017-07-28 DIAGNOSIS — G40309 Generalized idiopathic epilepsy and epileptic syndromes, not intractable, without status epilepticus: Secondary | ICD-10-CM

## 2017-07-28 MED ORDER — CARBAMAZEPINE 200 MG PO TABS: 400.0000 mg | ORAL_TABLET | Freq: Every day | ORAL | 3 refills | Status: DC

## 2017-07-28 NOTE — Patient Instructions (Signed)
Recent labs reviewed from 04/12/17 Renew medication (Epitol ) Call for any seizure activity Followup yearly and when necessary

## 2017-07-28 NOTE — Progress Notes (Signed)
I have read the note, and I agree with the clinical assessment and plan.  Shane Phillips,Shane Phillips   

## 2017-07-29 ENCOUNTER — Encounter: Payer: Self-pay | Admitting: Physician Assistant

## 2017-08-01 ENCOUNTER — Ambulatory Visit: Payer: BLUE CROSS/BLUE SHIELD | Admitting: Nurse Practitioner

## 2017-08-01 ENCOUNTER — Telehealth: Payer: Self-pay | Admitting: Nurse Practitioner

## 2017-08-01 NOTE — Telephone Encounter (Signed)
Pt called the office said he thinks [facility] [address] has been bought out by [employer] [facility]. He said everything in the store has [facility] on it but sign still reads [facility].  FYI

## 2017-08-02 ENCOUNTER — Telehealth: Payer: Self-pay | Admitting: Family Medicine

## 2017-08-02 NOTE — Telephone Encounter (Signed)
Shane Phillips pt had sent you an e-mail several days ago and would like a blood pressure medicine [facility] [address] please respond

## 2017-08-03 MED ORDER — LISINOPRIL 10 MG PO TABS: 10.0000 mg | ORAL_TABLET | Freq: Every day | ORAL | 1 refills | Status: DC

## 2017-08-03 NOTE — Telephone Encounter (Signed)
Patient would like to begin medication for HTN. Routed to provider for advisement./ S.Marja Adderley,CMA

## 2017-08-03 NOTE — Telephone Encounter (Signed)
Scheduled office visit to f/u HTN on tomorrow 08/04/17

## 2017-08-04 ENCOUNTER — Telehealth: Payer: Self-pay | Admitting: Physician Assistant

## 2017-08-04 ENCOUNTER — Ambulatory Visit: Payer: Self-pay | Admitting: Physician Assistant

## 2017-08-04 ENCOUNTER — Encounter: Payer: Self-pay | Admitting: Physician Assistant

## 2017-08-04 NOTE — Telephone Encounter (Signed)
Pt is callings stating that he was told a new blood pressure medication was going to be called in but after two days there is still nothing at the pharmacy the patient is not sure of the name of the medication  Best number [phone]

## 2017-08-05 NOTE — Telephone Encounter (Signed)
Discussed with patient

## 2017-08-07 ENCOUNTER — Other Ambulatory Visit: Payer: Self-pay | Admitting: *Deleted

## 2017-08-08 NOTE — Telephone Encounter (Signed)
Resolved.medication sent 08/03/17

## 2017-08-09 ENCOUNTER — Ambulatory Visit: Payer: BLUE CROSS/BLUE SHIELD | Admitting: Physician Assistant

## 2017-09-05 ENCOUNTER — Ambulatory Visit (INDEPENDENT_AMBULATORY_CARE_PROVIDER_SITE_OTHER): Payer: BLUE CROSS/BLUE SHIELD | Admitting: Emergency Medicine

## 2017-09-05 ENCOUNTER — Encounter: Payer: Self-pay | Admitting: Emergency Medicine

## 2017-09-05 VITALS — BP 150/80 | HR 55 | Temp 98.1°F | Resp 17 | Ht 71.5 in | Wt 182.0 lb

## 2017-09-05 DIAGNOSIS — I10 Essential (primary) hypertension: Secondary | ICD-10-CM | POA: Insufficient documentation

## 2017-09-05 MED ORDER — LISINOPRIL 10 MG PO TABS: 10.0000 mg | ORAL_TABLET | Freq: Every day | ORAL | 3 refills | Status: DC

## 2017-09-05 NOTE — Patient Instructions (Addendum)
   IF you received an x-ray today, you will receive an invoice from [facility]. Please contact [facility] at [phone] with questions or concerns regarding your invoice.   IF you received labwork today, you will receive an invoice from [facility]. Please contact [facility] at [phone] with questions or concerns regarding your invoice.   Our billing staff will not be able to assist you with questions regarding bills from these companies.  You will be contacted with the lab results as soon as they are available. The fastest way to get your results is to activate your My Chart account. Instructions are located on the last page of this paperwork. If you have not heard from us regarding the results in 2 weeks, please contact this office.     Hypertension Hypertension is another name for high blood pressure. High blood pressure forces your heart to work harder to pump blood. This can cause problems over time. There are two numbers in a blood pressure reading. There is a top number (systolic) over a bottom number (diastolic). It is best to have a blood pressure below 120/80. Healthy choices can help lower your blood pressure. You may need medicine to help lower your blood pressure if:  Your blood pressure cannot be lowered with healthy choices.  Your blood pressure is higher than 130/80.  Follow these instructions at home: Eating and drinking  If directed, follow the DASH eating plan. This diet includes: ? Filling half of your plate at each meal with fruits and vegetables. ? Filling one quarter of your plate at each meal with whole grains. Whole grains include whole wheat pasta, brown rice, and whole grain bread. ? Eating or drinking low-fat dairy products, such as skim milk or low-fat yogurt. ? Filling one quarter of your plate at each meal with low-fat (lean) proteins. Low-fat proteins include fish, skinless chicken, eggs, beans, and tofu. ? Avoiding fatty meat, cured  and processed meat, or chicken with skin. ? Avoiding premade or processed food.  Eat less than 1,500 mg of salt (sodium) a day.  Limit alcohol use to no more than 1 drink a day for nonpregnant women and 2 drinks a day for men. One drink equals 12 oz of beer, 5 oz of wine, or 1 oz of hard liquor. Lifestyle  Work with your doctor to stay at a healthy weight or to lose weight. Ask your doctor what the best weight is for you.  Get at least 30 minutes of exercise that causes your heart to beat faster (aerobic exercise) most days of the week. This may include walking, swimming, or biking.  Get at least 30 minutes of exercise that strengthens your muscles (resistance exercise) at least 3 days a week. This may include lifting weights or pilates.  Do not use any products that contain nicotine or tobacco. This includes cigarettes and e-cigarettes. If you need help quitting, ask your doctor.  Check your blood pressure at home as told by your doctor.  Keep all follow-up visits as told by your doctor. This is important. Medicines  Take over-the-counter and prescription medicines only as told by your doctor. Follow directions carefully.  Do not skip doses of blood pressure medicine. The medicine does not work as well if you skip doses. Skipping doses also puts you at risk for problems.  Ask your doctor about side effects or reactions to medicines that you should watch for. Contact a doctor if:  You think you are having a reaction to the   medicine you are taking.  You have headaches that keep coming back (recurring).  You feel dizzy.  You have swelling in your ankles.  You have trouble with your vision. Get help right away if:  You get a very bad headache.  You start to feel confused.  You feel weak or numb.  You feel faint.  You get very bad pain in your: ? Chest. ? Belly (abdomen).  You throw up (vomit) more than once.  You have trouble breathing. Summary  Hypertension is  another name for high blood pressure.  Making healthy choices can help lower blood pressure. If your blood pressure cannot be controlled with healthy choices, you may need to take medicine. This information is not intended to replace advice given to you by your health care provider. Make sure you discuss any questions you have with your health care provider. Document Released: 05/30/2008 Document Revised: 11/09/2016 Document Reviewed: 11/09/2016 Elsevier Interactive Patient Education  2018 Elsevier Inc.  

## 2017-09-05 NOTE — Progress Notes (Signed)
Shane Phillips 61 y.o.   Chief Complaint  Patient presents with  . Medication Refill    lisinopril    HISTORY OF PRESENT ILLNESS: This is a 61 y.o. male with h/o HTN needs refill of Lisinopril; has no complaints or medical concerns.  HPI   Prior to Admission medications   Medication Sig Start Date End Date Taking? Authorizing Provider  carbamazepine (EPITOL) 200 MG tablet Take 2 tablets (400 mg total) by mouth at bedtime. 07/28/17  Yes [name], NP  lisinopril (PRINIVIL,ZESTRIL) 10 MG tablet Take 1 tablet (10 mg total) by mouth daily. 08/03/17  Yes Ivar Drape D, PA    No Known Allergies  Patient Active Problem List   Diagnosis Date Noted  . Encounter for therapeutic drug monitoring 08/01/2016  . 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 Generalized convulsive epilepsy ([place]) 08/05/2013    Past Medical History:  Diagnosis Date  . High cholesterol   . Hypertension   . Seizure ([place])    last sz 2001    Past Surgical History:  Procedure Laterality Date  . None      Social History   Social History  . Marital status: Married    Spouse name: Shane Phillips  . Number of children: N/A  . Years of education: N/A   Occupational History  . Self Empolyed    Social History Main Topics  . Smoking status: Never Smoker  . Smokeless tobacco: Never Used  . Alcohol use No  . Drug use: No  . Sexual activity: Yes   Other Topics Concern  . 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   Patient is Married to [place].           Family History  Problem Relation Age of Onset  . Leukemia Mother   . Cancer Mother   . Heart attack Brother        2 in 6 months   . Hyperlipidemia Brother   . Cancer Sister        breast cancer  . Hyperlipidemia Sister   . Cancer Father        bladder cancer  . Cancer Maternal Grandfather        lung cancer  . Cancer Paternal Grandfather    melanoma  . Hyperlipidemia Sister      Review of Systems  Constitutional: Negative.   HENT:       Gurgling sensation in throat after speaking for a while; drinks water and it gets better.  Eyes: Negative.   Respiratory: Negative.  Negative for shortness of breath.   Cardiovascular: Negative.  Negative for chest pain.  Gastrointestinal: Negative.  Negative for abdominal pain, blood in stool, diarrhea, nausea and vomiting.  Genitourinary: Negative.  Negative for dysuria and hematuria.  Musculoskeletal: Negative.  Negative for back pain, myalgias and neck pain.  Skin: Negative.   Neurological: Negative for dizziness and headaches.  Endo/Heme/Allergies: Negative.   All other systems reviewed and are negative.  Vitals:   09/05/17 1135  BP: (!) 150/80  Pulse: (!) 55  Resp: 17  Temp: 98.1 F (36.7 C)  SpO2: 98%     Physical Exam  Constitutional: He is oriented to person, place, and time. He appears well-developed and well-nourished.  HENT:  Head: Normocephalic and atraumatic.  Nose: Nose normal.  Mouth/Throat: Oropharynx is clear and moist.  Eyes: Pupils are equal, round, and reactive to light. Conjunctivae and  EOM are normal.  Neck: Normal range of motion. Neck supple. No JVD present. Carotid bruit is not present.  Cardiovascular: Normal rate, regular rhythm, normal heart sounds and intact distal pulses.   Pulmonary/Chest: Effort normal and breath sounds normal.  Abdominal: Soft. Bowel sounds are normal. He exhibits no distension. There is no tenderness.  Musculoskeletal: Normal range of motion.  Neurological: He is alert and oriented to person, place, and time. No sensory deficit. He exhibits normal muscle tone.  Skin: Skin is warm and dry. Capillary refill takes less than 2 seconds. No rash noted.  Psychiatric: He has a normal mood and affect. His behavior is normal.  Vitals reviewed.    ASSESSMENT & PLAN:  Shane Phillips was seen today for medication refill.  Diagnoses and  all orders for this visit:  Essential hypertension  Other orders -     lisinopril (PRINIVIL,ZESTRIL) 10 MG tablet; Take 1 tablet (10 mg total) by mouth daily.    Patient Instructions       IF you received an x-ray today, you will receive an invoice from [facility]. Please contact [facility] at [phone] with questions or concerns regarding your invoice.   IF you received labwork today, you will receive an invoice from [place] [facility]. Please contact [facility] at [phone] with questions or concerns regarding your invoice.   Our billing staff will not be able to assist you with questions regarding bills from these companies.  You will be contacted with the lab results as soon as they are available. The fastest way to get your results is to activate your My Chart account. Instructions are located on the last page of this paperwork. If you have not heard from [place] regarding the results in 2 weeks, please contact this office.      Hypertension Hypertension is another name for high blood pressure. High blood pressure forces your heart to work harder to pump blood. This can cause problems over time. There are two numbers in a blood pressure reading. There is a top number (systolic) over a bottom number (diastolic). It is best to have a blood pressure below 120/80. Healthy choices can help lower your blood pressure. You may need medicine to help lower your blood pressure if:  Your blood pressure cannot be lowered with healthy choices.  Your blood pressure is higher than 130/80.  Follow these instructions at home: Eating and drinking  If directed, follow the DASH eating plan. This diet includes: ? Filling half of your plate at each meal with fruits and vegetables. ? Filling one quarter of your plate at each meal with whole grains. Whole grains include whole wheat pasta, brown rice, and whole grain bread. ? Eating or drinking low-fat dairy products, such as skim milk or  low-fat yogurt. ? Filling one quarter of your plate at each meal with low-fat (lean) proteins. Low-fat proteins include fish, skinless chicken, eggs, beans, and tofu. ? Avoiding fatty meat, cured and processed meat, or chicken with skin. ? Avoiding premade or processed food.  Eat less than 1,500 mg of salt (sodium) a day.  Limit alcohol use to no more than 1 drink a day for nonpregnant women and 2 drinks a day for men. One drink equals 12 oz of beer, 5 oz of wine, or 1 oz of hard liquor. Lifestyle  Work with your doctor to stay at a healthy weight or to lose weight. Ask your doctor what the best weight is for you.  Get at least 30 minutes of exercise  that causes your heart to beat faster (aerobic exercise) most days of the week. This may include walking, swimming, or biking.  Get at least 30 minutes of exercise that strengthens your muscles (resistance exercise) at least 3 days a week. This may include lifting weights or pilates.  Do not use any products that contain nicotine or tobacco. This includes cigarettes and e-cigarettes. If you need help quitting, ask your doctor.  Check your blood pressure at home as told by your doctor.  Keep all follow-up visits as told by your doctor. This is important. Medicines  Take over-the-counter and prescription medicines only as told by your doctor. Follow directions carefully.  Do not skip doses of blood pressure medicine. The medicine does not work as well if you skip doses. Skipping doses also puts you at risk for problems.  Ask your doctor about side effects or reactions to medicines that you should watch for. Contact a doctor if:  You think you are having a reaction to the medicine you are taking.  You have headaches that keep coming back (recurring).  You feel dizzy.  You have swelling in your ankles.  You have trouble with your vision. Get help right away if:  You get a very bad headache.  You start to feel confused.  You feel  weak or numb.  You feel faint.  You get very bad pain in your: ? Chest. ? Belly (abdomen).  You throw up (vomit) more than once.  You have trouble breathing. Summary  Hypertension is another name for high blood pressure.  Making healthy choices can help lower blood pressure. If your blood pressure cannot be controlled with healthy choices, you may need to take medicine. This information is not intended to replace advice given to you by your health care provider. Make sure you discuss any questions you have with your health care provider. Document Released: 05/30/2008 Document Revised: 11/09/2016 Document Reviewed: 11/09/2016 Elsevier Interactive Patient Education  2018 Elsevier Inc.     Agustina Caroli, MD [facility]

## 2017-10-23 ENCOUNTER — Encounter: Payer: Self-pay | Admitting: Gastroenterology

## 2017-12-01 ENCOUNTER — Ambulatory Visit (AMBULATORY_SURGERY_CENTER): Payer: Self-pay | Admitting: *Deleted

## 2017-12-01 ENCOUNTER — Other Ambulatory Visit: Payer: Self-pay

## 2017-12-01 VITALS — Ht 71.0 in | Wt 185.0 lb

## 2017-12-01 DIAGNOSIS — Z1211 Encounter for screening for malignant neoplasm of colon: Secondary | ICD-10-CM

## 2017-12-01 MED ORDER — NA SULFATE-K SULFATE-MG SULF 17.5-3.13-1.6 GM/177ML PO SOLN: 1.0000 | Freq: Once | ORAL | 0 refills | Status: AC

## 2017-12-01 NOTE — Progress Notes (Signed)
No egg or soy allergy known to patient  No issues with past sedation with any surgeries  or procedures, no past intubation   No diet pills per patient No home 02 use per patient  No blood thinners per patient  Pt denies issues with constipation  No A fib or A flutter  EMMI video sent to pt's e mail --  

## 2017-12-04 ENCOUNTER — Encounter: Payer: Self-pay | Admitting: Gastroenterology

## 2017-12-07 ENCOUNTER — Telehealth: Payer: Self-pay | Admitting: Gastroenterology

## 2017-12-07 NOTE — Telephone Encounter (Signed)
Pt cannot afford Suprep.  New instructions for Miralax split dose sent via MyChart to pt.

## 2017-12-15 ENCOUNTER — Other Ambulatory Visit: Payer: Self-pay

## 2017-12-15 ENCOUNTER — Ambulatory Visit (AMBULATORY_SURGERY_CENTER): Payer: BLUE CROSS/BLUE SHIELD | Admitting: Gastroenterology

## 2017-12-15 ENCOUNTER — Encounter: Payer: Self-pay | Admitting: Gastroenterology

## 2017-12-15 VITALS — BP 112/70 | HR 48 | Temp 97.8°F | Resp 10 | Ht 71.0 in | Wt 182.0 lb

## 2017-12-15 DIAGNOSIS — Z1212 Encounter for screening for malignant neoplasm of rectum: Secondary | ICD-10-CM

## 2017-12-15 DIAGNOSIS — K635 Polyp of colon: Secondary | ICD-10-CM

## 2017-12-15 DIAGNOSIS — D123 Benign neoplasm of transverse colon: Secondary | ICD-10-CM

## 2017-12-15 DIAGNOSIS — Z1211 Encounter for screening for malignant neoplasm of colon: Secondary | ICD-10-CM | POA: Diagnosis not present

## 2017-12-15 DIAGNOSIS — D120 Benign neoplasm of cecum: Secondary | ICD-10-CM | POA: Diagnosis not present

## 2017-12-15 MED ORDER — SODIUM CHLORIDE 0.9 % IV SOLN: 500.0000 mL | Freq: Once | INTRAVENOUS | Status: DC

## 2017-12-15 NOTE — Progress Notes (Signed)
A and O x3. Report to RN. Tolerated MAC anesthesia well.

## 2017-12-15 NOTE — Patient Instructions (Signed)
**  Handout given to patient on polyps**  YOU HAD AN ENDOSCOPIC PROCEDURE TODAY AT [facility]:   Refer to the procedure report that was given to you for any specific questions about what was found during the examination.  If the procedure report does not answer your questions, please call your gastroenterologist to clarify.  If you requested that your care partner not be given the details of your procedure findings, then the procedure report has been included in a sealed envelope for you to review at your convenience later.  YOU SHOULD EXPECT: Some feelings of bloating in the abdomen. Passage of more gas than usual.  Walking can help get rid of the air that was put into your GI tract during the procedure and reduce the bloating. If you had a lower endoscopy (such as a colonoscopy or flexible sigmoidoscopy) you may notice spotting of blood in your stool or on the toilet paper. If you underwent a bowel prep for your procedure, you may not have a normal bowel movement for a few days.  Please Note:  You might notice some irritation and congestion in your nose or some drainage.  This is from the oxygen used during your procedure.  There is no need for concern and it should clear up in a day or so.  SYMPTOMS TO REPORT IMMEDIATELY:   Following lower endoscopy (colonoscopy or flexible sigmoidoscopy):  Excessive amounts of blood in the stool  Significant tenderness or worsening of abdominal pains  Swelling of the abdomen that is new, acute  Fever of 100F or higher  For urgent or emergent issues, a gastroenterologist can be reached at any hour by calling [phone].   DIET:  We do recommend a small meal at first, but then you may proceed to your regular diet.  Drink plenty of fluids but you should avoid alcoholic beverages for 24 hours.  ACTIVITY:  You should plan to take it easy for the rest of today and you should NOT DRIVE or use heavy machinery until tomorrow (because of the  sedation medicines used during the test).    FOLLOW UP: Our staff will call the number listed on your records the next business day following your procedure to check on you and address any questions or concerns that you may have regarding the information given to you following your procedure. If we do not reach you, we will leave a message.  However, if you are feeling well and you are not experiencing any problems, there is no need to return our call.  We will assume that you have returned to your regular daily activities without incident.  If any biopsies were taken you will be contacted by phone or by letter within the next 1-3 weeks.  Please call us at [phone] if you have not heard about the biopsies in 3 weeks.    SIGNATURES/CONFIDENTIALITY: You and/or your care partner have signed paperwork which will be entered into your electronic medical record.  These signatures attest to the fact that that the information above on your After Visit Summary has been reviewed and is understood.  Full responsibility of the confidentiality of this discharge information lies with you and/or your care-partner.

## 2017-12-15 NOTE — Op Note (Signed)
[place] Patient Name: Shane Phillips Procedure Date: 12/15/2017 1:24 PM MRN: [id number] Endoscopist: Milus Banister , MD Age: 61 Referring MD:  Date of Birth: August 13, 1956 Gender: Male Account #: [id number] Procedure:                Colonoscopy Indications:              Screening for colorectal malignant neoplasm;                            colonoscopy 2008 was normal Medicines:                Monitored Anesthesia Care Procedure:                Pre-Anesthesia Assessment:                           - Prior to the procedure, a History and Physical                            was performed, and patient medications and                            allergies were reviewed. The patient's tolerance of                            previous anesthesia was also reviewed. The risks                            and benefits of the procedure and the sedation                            options and risks were discussed with the patient.                            All questions were answered, and informed consent                            was obtained. Prior Anticoagulants: The patient has                            taken no previous anticoagulant or antiplatelet                            agents. ASA Grade Assessment: II - A patient with                            mild systemic disease. After reviewing the risks                            and benefits, the patient was deemed in                            satisfactory condition to undergo the procedure.  After obtaining informed consent, the colonoscope                            was passed under direct vision. Throughout the                            procedure, the patient's blood pressure, pulse, and                            oxygen saturations were monitored continuously. The                            Colonoscope was introduced through the anus and                            advanced to the the cecum, identified by                             appendiceal orifice and ileocecal valve. The                            colonoscopy was performed without difficulty. The                            patient tolerated the procedure well. The quality                            of the bowel preparation was good. The ileocecal                            valve, appendiceal orifice, and rectum were                            photographed. Scope In: 1:42:08 PM Scope Out: 1:56:54 PM Scope Withdrawal Time: 0 hours 9 minutes 15 seconds  Total Procedure Duration: 0 hours 14 minutes 46 seconds  Findings:                 Two sessile polyps were found in the transverse                            colon and cecum. The polyps were 2 to 4 mm in size.                            These polyps were removed with a cold snare.                            Resection and retrieval were complete.                           The exam was otherwise without abnormality on                            direct and retroflexion views. Complications:  No immediate complications. Estimated blood loss:                            None. Estimated Blood Loss:     Estimated blood loss: none. Impression:               - Two 2 to 4 mm polyps in the transverse colon and                            in the cecum, removed with a cold snare. Resected                            and retrieved.                           - The examination was otherwise normal on direct                            and retroflexion views. Recommendation:           - Patient has a contact number available for                            emergencies. The signs and symptoms of potential                            delayed complications were discussed with the                            patient. Return to normal activities tomorrow.                            Written discharge instructions were provided to the                            patient.                           - Resume  previous diet.                           - Continue present medications.                           You will receive a letter within 2-3 weeks with the                            pathology results and my final recommendations.                           If the polyp(s) is proven to be 'pre-cancerous' on                            pathology, you will need repeat colonoscopy in 5  years. If the polyp(s) is NOT 'precancerous' on                            pathology then you should repeat colon cancer                            screening in 10 years with colonoscopy without need                            for colon cancer screening by any method prior to                            then (including stool testing). Milus Banister, MD 12/15/2017 1:58:45 PM This report has been signed electronically.

## 2017-12-15 NOTE — Progress Notes (Signed)
Called to room to assist during endoscopic procedure.  Patient ID and intended procedure confirmed with present staff. Received instructions for my participation in the procedure from the performing physician.  

## 2017-12-15 NOTE — Progress Notes (Signed)
Pt's states no medical or surgical changes since previsit or office visit. 

## 2017-12-18 ENCOUNTER — Telehealth: Payer: Self-pay | Admitting: *Deleted

## 2017-12-18 ENCOUNTER — Telehealth: Payer: Self-pay

## 2017-12-18 NOTE — Telephone Encounter (Signed)
No answer, second call.  Left message to call if questions or concerns. 

## 2017-12-18 NOTE — Telephone Encounter (Signed)
  [facility]/Recovery Room

## 2017-12-20 ENCOUNTER — Encounter: Payer: Self-pay | Admitting: Gastroenterology

## 2018-02-05 ENCOUNTER — Other Ambulatory Visit: Payer: Self-pay

## 2018-02-05 ENCOUNTER — Encounter: Payer: Self-pay | Admitting: Family Medicine

## 2018-02-05 ENCOUNTER — Ambulatory Visit: Payer: BLUE CROSS/BLUE SHIELD | Admitting: Family Medicine

## 2018-02-05 VITALS — BP 175/86 | HR 60 | Temp 98.0°F | Resp 16 | Ht 70.47 in | Wt 178.4 lb

## 2018-02-05 DIAGNOSIS — M62838 Other muscle spasm: Secondary | ICD-10-CM

## 2018-02-05 DIAGNOSIS — S161XXA Strain of muscle, fascia and tendon at neck level, initial encounter: Secondary | ICD-10-CM | POA: Diagnosis not present

## 2018-02-05 DIAGNOSIS — M545 Low back pain, unspecified: Secondary | ICD-10-CM

## 2018-02-05 MED ORDER — CYCLOBENZAPRINE HCL 5 MG PO TABS: ORAL_TABLET | ORAL | 0 refills | Status: DC
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
## 2018-02-07 ENCOUNTER — Encounter: Payer: Self-pay | Admitting: Family Medicine

## 2018-03-14 ENCOUNTER — Ambulatory Visit (INDEPENDENT_AMBULATORY_CARE_PROVIDER_SITE_OTHER): Payer: BLUE CROSS/BLUE SHIELD

## 2018-03-14 ENCOUNTER — Encounter: Payer: Self-pay | Admitting: Physician Assistant

## 2018-03-14 ENCOUNTER — Ambulatory Visit: Payer: BLUE CROSS/BLUE SHIELD | Admitting: Physician Assistant

## 2018-03-14 VITALS — BP 152/82 | HR 67 | Temp 98.5°F | Resp 16 | Ht 71.0 in | Wt 181.6 lb

## 2018-03-14 DIAGNOSIS — M25511 Pain in right shoulder: Secondary | ICD-10-CM

## 2018-03-14 DIAGNOSIS — M542 Cervicalgia: Secondary | ICD-10-CM

## 2018-03-14 DIAGNOSIS — J9801 Acute bronchospasm: Secondary | ICD-10-CM

## 2018-03-14 DIAGNOSIS — R05 Cough: Secondary | ICD-10-CM

## 2018-03-14 DIAGNOSIS — R059 Cough, unspecified: Secondary | ICD-10-CM

## 2018-03-14 MED ORDER — PREDNISONE 20 MG PO TABS: ORAL_TABLET | ORAL | 0 refills | Status: DC
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
## 2018-03-14 NOTE — Patient Instructions (Addendum)
Swap tylenol and ibuprofen every 6-8 hours as needed.  400mg  of ibuprofen for instance.  I would like you to take the flexeril as prescribed.  Please pick this up from the pharmacy.  You may ice the neck and shoulder three times per day as needed.   Shoulder Exercises Ask your health care provider which exercises are safe for you. Do exercises exactly as told by your health care provider and adjust them as directed. It is normal to feel mild stretching, pulling, tightness, or discomfort as you do these exercises, but you should stop right away if you feel sudden pain or your pain gets worse.Do not begin these exercises until told by your health care provider. RANGE OF MOTION EXERCISES These exercises warm up your muscles and joints and improve the movement and flexibility of your shoulder. These exercises also help to relieve pain, numbness, and tingling. These exercises involve stretching your injured shoulder directly. Exercise A: Pendulum  1. Stand near a wall or a surface that you can hold onto for balance. 2. Bend at the waist and let your left / right arm hang straight down. Use your other arm to support you. Keep your back straight and do not lock your knees. 3. Relax your left / right arm and shoulder muscles, and move your hips and your trunk so your left / right arm swings freely. Your arm should swing because of the motion of your body, not because you are using your arm or shoulder muscles. 4. Keep moving your body so your arm swings in the following directions, as told by your health care provider: ? Side to side. ? Forward and backward. ? In clockwise and counterclockwise circles. 5. Continue each motion for __________ seconds, or for as long as told by your health care provider. 6. Slowly return to the starting position. Repeat __________ times. Complete this exercise __________ times a day. Exercise B:Flexion, Standing  1. Stand and hold a broomstick, a cane, or a similar  object. Place your hands a little more than shoulder-width apart on the object. Your left / right hand should be palm-up, and your other hand should be palm-down. 2. Keep your elbow straight and keep your shoulder muscles relaxed. Push the stick down with your healthy arm to raise your left / right arm in front of your body, and then over your head until you feel a stretch in your shoulder. ? Avoid shrugging your shoulder while you raise your arm. Keep your shoulder blade tucked down toward the middle of your back. 3. Hold for __________ seconds. 4. Slowly return to the starting position. Repeat __________ times. Complete this exercise __________ times a day. Exercise C: Abduction, Standing 1. Stand and hold a broomstick, a cane, or a similar object. Place your hands a little more than shoulder-width apart on the object. Your left / right hand should be palm-up, and your other hand should be palm-down. 2. While keeping your elbow straight and your shoulder muscles relaxed, push the stick across your body toward your left / right side. Raise your left / right arm to the side of your body and then over your head until you feel a stretch in your shoulder. ? Do not raise your arm above shoulder height, unless your health care provider tells you to do that. ? Avoid shrugging your shoulder while you raise your arm. Keep your shoulder blade tucked down toward the middle of your back. 3. Hold for __________ seconds. 4. Slowly return to the  starting position. Repeat __________ times. Complete this exercise __________ times a day. Exercise D:Internal Rotation  1. Place your left / right hand behind your back, palm-up. 2. Use your other hand to dangle an exercise band, a towel, or a similar object over your shoulder. Grasp the band with your left / right hand so you are holding onto both ends. 3. Gently pull up on the band until you feel a stretch in the front of your left / right shoulder. ? Avoid  shrugging your shoulder while you raise your arm. Keep your shoulder blade tucked down toward the middle of your back. 4. Hold for __________ seconds. 5. Release the stretch by letting go of the band and lowering your hands. Repeat __________ times. Complete this exercise __________ times a day. STRETCHING EXERCISES These exercises warm up your muscles and joints and improve the movement and flexibility of your shoulder. These exercises also help to relieve pain, numbness, and tingling. These exercises are done using your healthy shoulder to help stretch the muscles of your injured shoulder. Exercise E: Warehouse manager (External Rotation and Abduction)  1. Stand in a doorway with one of your feet slightly in front of the other. This is called a staggered stance. If you cannot reach your forearms to the door frame, stand facing a corner of a room. 2. Choose one of the following positions as told by your health care provider: ? Place your hands and forearms on the door frame above your head. ? Place your hands and forearms on the door frame at the height of your head. ? Place your hands on the door frame at the height of your elbows. 3. Slowly move your weight onto your front foot until you feel a stretch across your chest and in the front of your shoulders. Keep your head and chest upright and keep your abdominal muscles tight. 4. Hold for __________ seconds. 5. To release the stretch, shift your weight to your back foot. Repeat __________ times. Complete this stretch __________ times a day. Exercise F:Extension, Standing 1. Stand and hold a broomstick, a cane, or a similar object behind your back. ? Your hands should be a little wider than shoulder-width apart. ? Your palms should face away from your back. 2. Keeping your elbows straight and keeping your shoulder muscles relaxed, move the stick away from your body until you feel a stretch in your shoulder. ? Avoid shrugging your shoulders while  you move the stick. Keep your shoulder blade tucked down toward the middle of your back. 3. Hold for __________ seconds. 4. Slowly return to the starting position. Repeat __________ times. Complete this exercise __________ times a day. STRENGTHENING EXERCISES These exercises build strength and endurance in your shoulder. Endurance is the ability to use your muscles for a long time, even after they get tired. Exercise G:External Rotation  1. Sit in a stable chair without armrests. 2. Secure an exercise band at elbow height on your left / right side. 3. Place a soft object, such as a folded towel or a small pillow, between your left / right upper arm and your body to move your elbow a few inches away (about 10 cm) from your side. 4. Hold the end of the band so it is tight and there is no slack. 5. Keeping your elbow pressed against the soft object, move your left / right forearm out, away from your abdomen. Keep your body steady so only your forearm moves. 6. Hold for __________ seconds.  7. Slowly return to the starting position. Repeat __________ times. Complete this exercise __________ times a day. Exercise H:Shoulder Abduction  1. Sit in a stable chair without armrests, or stand. 2. Hold a __________ weight in your left / right hand, or hold an exercise band with both hands. 3. Start with your arms straight down and your left / right palm facing in, toward your body. 4. Slowly lift your left / right hand out to your side. Do not lift your hand above shoulder height unless your health care provider tells you that this is safe. ? Keep your arms straight. ? Avoid shrugging your shoulder while you do this movement. Keep your shoulder blade tucked down toward the middle of your back. 5. Hold for __________ seconds. 6. Slowly lower your arm, and return to the starting position. Repeat __________ times. Complete this exercise __________ times a day. Exercise I:Shoulder Extension 1. Sit in a  stable chair without armrests, or stand. 2. Secure an exercise band to a stable object in front of you where it is at shoulder height. 3. Hold one end of the exercise band in each hand. Your palms should face each other. 4. Straighten your elbows and lift your hands up to shoulder height. 5. Step back, away from the secured end of the exercise band, until the band is tight and there is no slack. 6. Squeeze your shoulder blades together as you pull your hands down to the sides of your thighs. Stop when your hands are straight down by your sides. Do not let your hands go behind your body. 7. Hold for __________ seconds. 8. Slowly return to the starting position. Repeat __________ times. Complete this exercise __________ times a day. Exercise J:Standing Shoulder Row 1. Sit in a stable chair without armrests, or stand. 2. Secure an exercise band to a stable object in front of you so it is at waist height. 3. Hold one end of the exercise band in each hand. Your palms should be in a thumbs-up position. 4. Bend each of your elbows to an "L" shape (about 90 degrees) and keep your upper arms at your sides. 5. Step back until the band is tight and there is no slack. 6. Slowly pull your elbows back behind you. 7. Hold for __________ seconds. 8. Slowly return to the starting position. Repeat __________ times. Complete this exercise __________ times a day. Exercise K:Shoulder Press-Ups  1. Sit in a stable chair that has armrests. Sit upright, with your feet flat on the floor. 2. Put your hands on the armrests so your elbows are bent and your fingers are pointing forward. Your hands should be about even with the sides of your body. 3. Push down on the armrests and use your arms to lift yourself off of the chair. Straighten your elbows and lift yourself up as much as you comfortably can. ? Move your shoulder blades down, and avoid letting your shoulders move up toward your ears. ? Keep your feet on the  ground. As you get stronger, your feet should support less of your body weight as you lift yourself up. 4. Hold for __________ seconds. 5. Slowly lower yourself back into the chair. Repeat __________ times. Complete this exercise __________ times a day. Exercise L: Wall Push-Ups  1. Stand so you are facing a stable wall. Your feet should be about one arm-length away from the wall. 2. Lean forward and place your palms on the wall at shoulder height. 3. Keep your feet  flat on the floor as you bend your elbows and lean forward toward the wall. 4. Hold for __________ seconds. 5. Straighten your elbows to push yourself back to the starting position. Repeat __________ times. Complete this exercise __________ times a day. This information is not intended to replace advice given to you by your health care provider. Make sure you discuss any questions you have with your health care provider. Document Released: 10/26/2005 Document Revised: 09/05/2016 Document Reviewed: 08/23/2015 Elsevier Interactive Patient Education  2018 [employer].   IF you received an x-ray today, you will receive an invoice from [facility]. Please contact [facility] at [phone] with questions or concerns regarding your invoice.   IF you received labwork today, you will receive an invoice from [facility]. Please contact [facility] at [phone] with questions or concerns regarding your invoice.   Our billing staff will not be able to assist you with questions regarding bills from these companies.  You will be contacted with the lab results as soon as they are available. The fastest way to get your results is to activate your My Chart account. Instructions are located on the last page of this paperwork. If you have not heard from [place] regarding the results in 2 weeks, please contact this office.

## 2018-03-19 ENCOUNTER — Encounter: Payer: Self-pay | Admitting: Physician Assistant

## 2018-03-21 ENCOUNTER — Encounter: Payer: Self-pay | Admitting: Physician Assistant

## 2018-03-28 ENCOUNTER — Ambulatory Visit: Payer: BLUE CROSS/BLUE SHIELD | Admitting: Physician Assistant

## 2018-04-04 ENCOUNTER — Ambulatory Visit: Payer: BLUE CROSS/BLUE SHIELD | Admitting: Emergency Medicine

## 2018-04-04 ENCOUNTER — Encounter: Payer: Self-pay | Admitting: Physician Assistant

## 2018-04-04 ENCOUNTER — Encounter: Payer: Self-pay | Admitting: Emergency Medicine

## 2018-04-04 ENCOUNTER — Other Ambulatory Visit: Payer: Self-pay

## 2018-04-04 VITALS — BP 130/82 | HR 63 | Temp 98.2°F | Resp 16 | Ht 70.25 in | Wt 175.4 lb

## 2018-04-04 DIAGNOSIS — R05 Cough: Secondary | ICD-10-CM | POA: Diagnosis not present

## 2018-04-04 DIAGNOSIS — T464X5A Adverse effect of angiotensin-converting-enzyme inhibitors, initial encounter: Secondary | ICD-10-CM

## 2018-04-04 DIAGNOSIS — I10 Essential (primary) hypertension: Secondary | ICD-10-CM | POA: Diagnosis not present

## 2018-04-04 DIAGNOSIS — R058 Other specified cough: Secondary | ICD-10-CM | POA: Insufficient documentation

## 2018-04-04 MED ORDER — AMLODIPINE BESYLATE 5 MG PO TABS: 5.0000 mg | ORAL_TABLET | Freq: Every day | ORAL | 3 refills | Status: DC

## 2018-04-04 NOTE — Patient Instructions (Addendum)
   IF you received an x-ray today, you will receive an invoice from [facility]. Please contact [facility] at [phone] with questions or concerns regarding your invoice.   IF you received labwork today, you will receive an invoice from [facility]. Please contact [facility] at [phone] with questions or concerns regarding your invoice.   Our billing staff will not be able to assist you with questions regarding bills from these companies.  You will be contacted with the lab results as soon as they are available. The fastest way to get your results is to activate your My Chart account. Instructions are located on the last page of this paperwork. If you have not heard from us regarding the results in 2 weeks, please contact this office.     Hypertension Hypertension, commonly called high blood pressure, is when the force of blood pumping through the arteries is too strong. The arteries are the blood vessels that carry blood from the heart throughout the body. Hypertension forces the heart to work harder to pump blood and may cause arteries to become narrow or stiff. Having untreated or uncontrolled hypertension can cause heart attacks, strokes, kidney disease, and other problems. A blood pressure reading consists of a higher number over a lower number. Ideally, your blood pressure should be below 120/80. The first ("top") number is called the systolic pressure. It is a measure of the pressure in your arteries as your heart beats. The second ("bottom") number is called the diastolic pressure. It is a measure of the pressure in your arteries as the heart relaxes. What are the causes? The cause of this condition is not known. What increases the risk? Some risk factors for high blood pressure are under your control. Others are not. Factors you can change  Smoking.  Having type 2 diabetes mellitus, high cholesterol, or both.  Not getting enough exercise or physical  activity.  Being overweight.  Having too much fat, sugar, calories, or salt (sodium) in your diet.  Drinking too much alcohol. Factors that are difficult or impossible to change  Having chronic kidney disease.  Having a family history of high blood pressure.  Age. Risk increases with age.  Race. You may be at higher risk if you are African-American.  Gender. Men are at higher risk than women before age 45. After age 65, women are at higher risk than men.  Having obstructive sleep apnea.  Stress. What are the signs or symptoms? Extremely high blood pressure (hypertensive crisis) may cause:  Headache.  Anxiety.  Shortness of breath.  Nosebleed.  Nausea and vomiting.  Severe chest pain.  Jerky movements you cannot control (seizures).  How is this diagnosed? This condition is diagnosed by measuring your blood pressure while you are seated, with your arm resting on a surface. The cuff of the blood pressure monitor will be placed directly against the skin of your upper arm at the level of your heart. It should be measured at least twice using the same arm. Certain conditions can cause a difference in blood pressure between your right and left arms. Certain factors can cause blood pressure readings to be lower or higher than normal (elevated) for a short period of time:  When your blood pressure is higher when you are in a health care provider's office than when you are at home, this is called white coat hypertension. Most people with this condition do not need medicines.  When your blood pressure is higher at home than when you   are in a health care provider's office, this is called masked hypertension. Most people with this condition may need medicines to control blood pressure.  If you have a high blood pressure reading during one visit or you have normal blood pressure with other risk factors:  You may be asked to return on a different day to have your blood pressure  checked again.  You may be asked to monitor your blood pressure at home for 1 week or longer.  If you are diagnosed with hypertension, you may have other blood or imaging tests to help your health care provider understand your overall risk for other conditions. How is this treated? This condition is treated by making healthy lifestyle changes, such as eating healthy foods, exercising more, and reducing your alcohol intake. Your health care provider may prescribe medicine if lifestyle changes are not enough to get your blood pressure under control, and if:  Your systolic blood pressure is above 130.  Your diastolic blood pressure is above 80.  Your personal target blood pressure may vary depending on your medical conditions, your age, and other factors. Follow these instructions at home: Eating and drinking  Eat a diet that is high in fiber and potassium, and low in sodium, added sugar, and fat. An example eating plan is called the DASH (Dietary Approaches to Stop Hypertension) diet. To eat this way: ? Eat plenty of fresh fruits and vegetables. Try to fill half of your plate at each meal with fruits and vegetables. ? Eat whole grains, such as whole wheat pasta, brown rice, or whole grain bread. Fill about one quarter of your plate with whole grains. ? Eat or drink low-fat dairy products, such as skim milk or low-fat yogurt. ? Avoid fatty cuts of meat, processed or cured meats, and poultry with skin. Fill about one quarter of your plate with lean proteins, such as fish, chicken without skin, beans, eggs, and tofu. ? Avoid premade and processed foods. These tend to be higher in sodium, added sugar, and fat.  Reduce your daily sodium intake. Most people with hypertension should eat less than 1,500 mg of sodium a day.  Limit alcohol intake to no more than 1 drink a day for nonpregnant women and 2 drinks a day for men. One drink equals 12 oz of beer, 5 oz of wine, or 1 oz of hard  liquor. Lifestyle  Work with your health care provider to maintain a healthy body weight or to lose weight. Ask what an ideal weight is for you.  Get at least 30 minutes of exercise that causes your heart to beat faster (aerobic exercise) most days of the week. Activities may include walking, swimming, or biking.  Include exercise to strengthen your muscles (resistance exercise), such as pilates or lifting weights, as part of your weekly exercise routine. Try to do these types of exercises for 30 minutes at least 3 days a week.  Do not use any products that contain nicotine or tobacco, such as cigarettes and e-cigarettes. If you need help quitting, ask your health care provider.  Monitor your blood pressure at home as told by your health care provider.  Keep all follow-up visits as told by your health care provider. This is important. Medicines  Take over-the-counter and prescription medicines only as told by your health care provider. Follow directions carefully. Blood pressure medicines must be taken as prescribed.  Do not skip doses of blood pressure medicine. Doing this puts you at risk for problems and   can make the medicine less effective.  Ask your health care provider about side effects or reactions to medicines that you should watch for. Contact a health care provider if:  You think you are having a reaction to a medicine you are taking.  You have headaches that keep coming back (recurring).  You feel dizzy.  You have swelling in your ankles.  You have trouble with your vision. Get help right away if:  You develop a severe headache or confusion.  You have unusual weakness or numbness.  You feel faint.  You have severe pain in your chest or abdomen.  You vomit repeatedly.  You have trouble breathing. Summary  Hypertension is when the force of blood pumping through your arteries is too strong. If this condition is not controlled, it may put you at risk for serious  complications.  Your personal target blood pressure may vary depending on your medical conditions, your age, and other factors. For most people, a normal blood pressure is less than 120/80.  Hypertension is treated with lifestyle changes, medicines, or a combination of both. Lifestyle changes include weight loss, eating a healthy, low-sodium diet, exercising more, and limiting alcohol. This information is not intended to replace advice given to you by your health care provider. Make sure you discuss any questions you have with your health care provider. Document Released: 12/12/2005 Document Revised: 11/09/2016 Document Reviewed: 11/09/2016 Elsevier Interactive Patient Education  2018 Elsevier Inc.  

## 2018-04-04 NOTE — Progress Notes (Signed)
[name] 62 y.o.   Chief Complaint  Patient presents with  . Medication Refill    Lisinopril - per patient wants to change because of side effects having a cough    HISTORY OF PRESENT ILLNESS: This is a 62 y.o. male with history of hypertension on lisinopril.  Recently had dose increased to 20 mg a day.  Developed cough.  Wants medication changed.  Has been off lisinopril for 5 days now.  Asymptomatic.  HPI   Prior to Admission medications   Medication Sig Start Date End Date Taking? Authorizing Provider  carbamazepine (EPITOL) 200 MG tablet Take 2 tablets (400 mg total) by mouth at bedtime. 07/28/17  Yes [name], NP  co-enzyme Q-10 30 MG capsule Take 30 mg by mouth daily.   Yes [provider]  cyclobenzaprine (FLEXERIL) 5 MG tablet 1 pill by mouth up to every 8 hours as needed. Start with one pill by mouth each bedtime as needed due to sedation 02/05/18  Yes Wendie Agreste, MD  GARLIC PO Take by mouth daily.   Yes [provider]  glucosamine-chondroitin 500-400 MG tablet Take 1 tablet by mouth daily.   Yes [provider]  Misc Natural Products ([place]) CAPS Take by mouth daily.   Yes [provider]  Omega-3 Fatty Acids (FISH OIL) 1000 MG CAPS Take by mouth daily.   Yes [provider]  UNABLE TO FIND Med Name: [name] Blood Pressure Supplement   Yes [provider]  amLODipine (NORVASC) 5 MG tablet Take 1 tablet (5 mg total) by mouth daily. 04/04/18   Horald Pollen, MD  predniSONE (DELTASONE) 20 MG tablet Take 2.5 PO QAM x1day, 2 PO QAM x1day, 1.5 PO QAM x1day, 1 PO QAM x1day Patient not taking: Reported on 04/04/2018 03/14/18   Ivar Drape D, PA    No Known Allergies  Patient Active Problem List   Diagnosis Date Noted  . Essential hypertension 09/05/2017  . Encounter for therapeutic drug monitoring 08/01/2016  . Localization-related focal epilepsy with simple partial  seizures ([place]) 08/05/2013  . Generalized convulsive epilepsy ([place]) 08/05/2013    Past Medical History:  Diagnosis Date  . Arthritis    hands   . High cholesterol   . Hypertension   . Seizure ([place])    last sz 2001    Past Surgical History:  Procedure Laterality Date  . COLONOSCOPY  10/15/2007  . None      Social History   Socioeconomic History  . Marital status: Married    Spouse name: [name]  . Number of children: Not on file  . Years of education: Not on file  . Highest education level: Not on file  Occupational History  . Occupation: Self Empolyed  Social Needs  . Financial resource strain: Not on file  . Food insecurity:    Worry: Not on file    Inability: Not on file  . Transportation needs:    Medical: Not on file    Non-medical: Not on file  Tobacco Use  . Smoking status: Never Smoker  . Smokeless tobacco: Never Used  Substance and Sexual Activity  . Alcohol use: No  . Drug use: Yes    Types: "Crack" cocaine  . Sexual activity: Yes  Lifestyle  . Physical activity:    Days per week: Not on file    Minutes per session: Not on file  . Stress: Not on file  Relationships  . Social connections:  Talks on phone: Not on file    Gets together: Not on file    Attends religious service: Not on file    Active member of club or organization: Not on file    Attends meetings of clubs or organizations: Not on file    Relationship status: Not on file  . Intimate partner violence:    Fear of current or ex partner: Not on file    Emotionally abused: Not on file    Physically abused: Not on file    Forced sexual activity: Not on file  Other Topics Concern  . 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   Patient is Married to [place].        Family History  Problem Relation Age of Onset  . Leukemia Mother   . Cancer Mother   . Heart attack Brother        2 in 6 months   . Hyperlipidemia Brother   . Cancer Sister         breast cancer  . Hyperlipidemia Sister   . Breast cancer Sister   . Cancer Father        bladder cancer  . Cancer Maternal Grandfather        lung cancer  . Cancer Paternal Grandfather        melanoma  . Hyperlipidemia Sister   . Colon cancer Neg Hx   . Colon polyps Neg Hx   . Esophageal cancer Neg Hx   . Rectal cancer Neg Hx   . Stomach cancer Neg Hx      Review of Systems  Constitutional: Negative.  Negative for chills, fever and weight loss.  HENT: Negative.  Negative for sore throat.   Eyes: Negative.   Respiratory: Positive for cough. Negative for shortness of breath and wheezing.   Cardiovascular: Negative.  Negative for chest pain, palpitations and leg swelling.  Gastrointestinal: Negative.  Negative for abdominal pain, blood in stool, diarrhea, melena, nausea and vomiting.  Genitourinary: Negative.  Negative for dysuria and hematuria.  Musculoskeletal: Negative.  Negative for back pain, myalgias and neck pain.  Skin: Negative.  Negative for rash.  Neurological: Negative.  Negative for dizziness and headaches.  Endo/Heme/Allergies: Negative.   All other systems reviewed and are negative.   Vitals:   04/04/18 1132 04/04/18 1137  BP: (!) 160/78 130/82  Pulse: 63   Resp: 16   Temp: 98.2 F (36.8 C)   SpO2: 94%     Physical Exam  Constitutional: He is oriented to person, place, and time. He appears well-developed and well-nourished.  HENT:  Head: Normocephalic and atraumatic.  Nose: Nose normal.  Mouth/Throat: Oropharynx is clear and moist.  Eyes: Pupils are equal, round, and reactive to light. Conjunctivae and EOM are normal.  Neck: Normal range of motion. Neck supple.  Cardiovascular: Normal rate, regular rhythm and normal heart sounds.  Pulmonary/Chest: Effort normal and breath sounds normal.  Abdominal: Soft. Bowel sounds are normal.  Musculoskeletal: Normal range of motion.  Neurological: He is alert and oriented to person, place, and time. No  sensory deficit. He exhibits normal muscle tone.  Skin: Skin is warm and dry. Capillary refill takes less than 2 seconds. No rash noted.  Psychiatric: He has a normal mood and affect. His behavior is normal.  Vitals reviewed.  Essential hypertension Stable.  Encouraged to monitor blood pressure at home.  Advised to stop lisinopril and start amlodipine 5 mg daily.  Follow-up in 6 months but earlier if needed.   A total of 25 minutes was spent in the room with the patient, greater than 50% of which was in counseling/coordination of care.  ASSESSMENT & PLAN:  Tajee was seen today for medication refill.  Diagnoses and all orders for this visit:  Essential hypertension -     amLODipine (NORVASC) 5 MG tablet; Take 1 tablet (5 mg total) by mouth daily.  Cough due to ACE inhibitor    Patient Instructions       IF you received an x-ray today, you will receive an invoice from [facility]. Please contact [facility] at [phone] with questions or concerns regarding your invoice.   IF you received labwork today, you will receive an invoice from [facility]. Please contact [facility] at [phone] with questions or concerns regarding your invoice.   Our billing staff will not be able to assist you with questions regarding bills from these companies.  You will be contacted with the lab results as soon as they are available. The fastest way to get your results is to activate your My Chart account. Instructions are located on the last page of this paperwork. If you have not heard from [place] regarding the results in 2 weeks, please contact this office.     Hypertension Hypertension, commonly called high blood pressure, is when the force of blood pumping through the arteries is too strong. The arteries are the blood vessels that carry blood from the heart throughout the body. Hypertension forces the heart to work harder to pump blood and may cause arteries to become narrow or  stiff. Having untreated or uncontrolled hypertension can cause heart attacks, strokes, kidney disease, and other problems. A blood pressure reading consists of a higher number over a lower number. Ideally, your blood pressure should be below 120/80. The first ("top") number is called the systolic pressure. It is a measure of the pressure in your arteries as your heart beats. The second ("bottom") number is called the diastolic pressure. It is a measure of the pressure in your arteries as the heart relaxes. What are the causes? The cause of this condition is not known. What increases the risk? Some risk factors for high blood pressure are under your control. Others are not. Factors you can change  Smoking.  Having type 2 diabetes mellitus, high cholesterol, or both.  Not getting enough exercise or physical activity.  Being overweight.  Having too much fat, sugar, calories, or salt (sodium) in your diet.  Drinking too much alcohol. Factors that are difficult or impossible to change  Having chronic kidney disease.  Having a family history of high blood pressure.  Age. Risk increases with age.  Race. You may be at higher risk if you are African-American.  Gender. Men are at higher risk than women before age 40. After age 78, women are at higher risk than men.  Having obstructive sleep apnea.  Stress. What are the signs or symptoms? Extremely high blood pressure (hypertensive crisis) may cause:  Headache.  Anxiety.  Shortness of breath.  Nosebleed.  Nausea and vomiting.  Severe chest pain.  Jerky movements you cannot control (seizures).  How is this diagnosed? This condition is diagnosed by measuring your blood pressure while you are seated, with your arm resting on a surface. The cuff of the blood pressure monitor will be placed directly against the skin of your upper arm at the level of your heart. It should be measured at least twice  using the same arm. Certain  conditions can cause a difference in blood pressure between your right and left arms. Certain factors can cause blood pressure readings to be lower or higher than normal (elevated) for a short period of time:  When your blood pressure is higher when you are in a health care provider's office than when you are at home, this is called white coat hypertension. Most people with this condition do not need medicines.  When your blood pressure is higher at home than when you are in a health care provider's office, this is called masked hypertension. Most people with this condition may need medicines to control blood pressure.  If you have a high blood pressure reading during one visit or you have normal blood pressure with other risk factors:  You may be asked to return on a different day to have your blood pressure checked again.  You may be asked to monitor your blood pressure at home for 1 week or longer.  If you are diagnosed with hypertension, you may have other blood or imaging tests to help your health care provider understand your overall risk for other conditions. How is this treated? This condition is treated by making healthy lifestyle changes, such as eating healthy foods, exercising more, and reducing your alcohol intake. Your health care provider may prescribe medicine if lifestyle changes are not enough to get your blood pressure under control, and if:  Your systolic blood pressure is above 130.  Your diastolic blood pressure is above 80.  Your personal target blood pressure may vary depending on your medical conditions, your age, and other factors. Follow these instructions at home: Eating and drinking  Eat a diet that is high in fiber and potassium, and low in sodium, added sugar, and fat. An example eating plan is called the DASH (Dietary Approaches to Stop Hypertension) diet. To eat this way: ? Eat plenty of fresh fruits and vegetables. Try to fill half of your plate at each  meal with fruits and vegetables. ? Eat whole grains, such as whole wheat pasta, brown rice, or whole grain bread. Fill about one quarter of your plate with whole grains. ? Eat or drink low-fat dairy products, such as skim milk or low-fat yogurt. ? Avoid fatty cuts of meat, processed or cured meats, and poultry with skin. Fill about one quarter of your plate with lean proteins, such as fish, chicken without skin, beans, eggs, and tofu. ? Avoid premade and processed foods. These tend to be higher in sodium, added sugar, and fat.  Reduce your daily sodium intake. Most people with hypertension should eat less than 1,500 mg of sodium a day.  Limit alcohol intake to no more than 1 drink a day for nonpregnant women and 2 drinks a day for men. One drink equals 12 oz of beer, 5 oz of wine, or 1 oz of hard liquor. Lifestyle  Work with your health care provider to maintain a healthy body weight or to lose weight. Ask what an ideal weight is for you.  Get at least 30 minutes of exercise that causes your heart to beat faster (aerobic exercise) most days of the week. Activities may include walking, swimming, or biking.  Include exercise to strengthen your muscles (resistance exercise), such as pilates or lifting weights, as part of your weekly exercise routine. Try to do these types of exercises for 30 minutes at least 3 days a week.  Do not use any products that contain nicotine or  tobacco, such as cigarettes and e-cigarettes. If you need help quitting, ask your health care provider.  Monitor your blood pressure at home as told by your health care provider.  Keep all follow-up visits as told by your health care provider. This is important. Medicines  Take over-the-counter and prescription medicines only as told by your health care provider. Follow directions carefully. Blood pressure medicines must be taken as prescribed.  Do not skip doses of blood pressure medicine. Doing this puts you at risk for  problems and can make the medicine less effective.  Ask your health care provider about side effects or reactions to medicines that you should watch for. Contact a health care provider if:  You think you are having a reaction to a medicine you are taking.  You have headaches that keep coming back (recurring).  You feel dizzy.  You have swelling in your ankles.  You have trouble with your vision. Get help right away if:  You develop a severe headache or confusion.  You have unusual weakness or numbness.  You feel faint.  You have severe pain in your chest or abdomen.  You vomit repeatedly.  You have trouble breathing. Summary  Hypertension is when the force of blood pumping through your arteries is too strong. If this condition is not controlled, it may put you at risk for serious complications.  Your personal target blood pressure may vary depending on your medical conditions, your age, and other factors. For most people, a normal blood pressure is less than 120/80.  Hypertension is treated with lifestyle changes, medicines, or a combination of both. Lifestyle changes include weight loss, eating a healthy, low-sodium diet, exercising more, and limiting alcohol. This information is not intended to replace advice given to you by your health care provider. Make sure you discuss any questions you have with your health care provider. Document Released: 12/12/2005 Document Revised: 11/09/2016 Document Reviewed: 11/09/2016 Elsevier Interactive Patient Education  2018 Elsevier Inc.     Agustina Caroli, MD [facility]

## 2018-04-04 NOTE — Assessment & Plan Note (Signed)
Stable.  Encouraged to monitor blood pressure at home.  Advised to stop lisinopril and start amlodipine 5 mg daily.  Follow-up in 6 months but earlier if needed.

## 2018-04-12 ENCOUNTER — Ambulatory Visit: Payer: BLUE CROSS/BLUE SHIELD | Admitting: Emergency Medicine

## 2018-06-18 ENCOUNTER — Telehealth: Payer: Self-pay | Admitting: Nurse Practitioner

## 2018-06-18 NOTE — Telephone Encounter (Signed)
Shane Phillips with [name] calling to see if generic can be substituted for carbamazepine (EPITOL) 200 MG tablet.

## 2018-06-19 NOTE — Telephone Encounter (Signed)
yes

## 2018-06-19 NOTE — Telephone Encounter (Signed)
LMVM for pharmacy at [facility], returning Shane Phillips's call that per CM/NP it is ok to use generic for epitol (carbamazepine).  She is to call back if questions.

## 2018-07-27 NOTE — Progress Notes (Signed)
[facility]  PATIENT: Shane Phillips DOB: [birth year]   REASON FOR VISIT:follow-up for seizure disorder HISTORY FROM:patient    HISTORY OF PRESENT ILLNESS:Shane Phillips, 62 year-old white male returns today for followup. He has a history of seizure disorder generalized, is currently well controlled on carbamazepine. He has been followed at [facility]. EEG has demonstrated slowing in the right hemisphere, onset of seizures occurred at age 24, presumptive of prenatal difficulty. His last seizure occurred in August of 2001 after missing several doses of his medication. He denies any difficulty tolerating his medications, no difficulty with daytime drowsiness, no falls, no feelings of being off balance. He's had no visual difficulties, he has rare headache. No recent labs.  He needs refills on his medication   REVIEW OF SYSTEMS: Full 14 system review of systems performed and notable only for those listed, all others are neg:  Constitutional: neg  Cardiovascular: neg Ear/Nose/Throat: neg  Skin: neg Eyes: neg Respiratory: neg Gastroitestinal: neg  Hematology/Lymphatic: neg  Endocrine: neg Musculoskeletal:neg Allergy/Immunology: neg Neurological: History of seizure disorder Psychiatric: neg Sleep : neg   ALLERGIES: No Known Allergies  HOME MEDICATIONS: Outpatient Medications Prior to Visit  Medication Sig Dispense Refill  . amLODipine (NORVASC) 5 MG tablet Take 1 tablet (5 mg total) by mouth daily. 90 tablet 3  . carbamazepine (EPITOL) 200 MG tablet Take 2 tablets (400 mg total) by mouth at bedtime. 180 tablet 3  . co-enzyme Q-10 30 MG capsule Take 30 mg by mouth daily.    Marland Kitchen GARLIC PO Take by mouth daily.    Marland Kitchen glucosamine-chondroitin 500-400 MG tablet Take 1 tablet by mouth daily.    . Omega-3 Fatty Acids (FISH OIL) 1000 MG CAPS Take by mouth daily.    Marland Kitchen UNABLE TO FIND Med Name: Shane Phillips Naturopathic Blood Pressure Supplement    . cyclobenzaprine (FLEXERIL) 5  MG tablet 1 pill by mouth up to every 8 hours as needed. Start with one pill by mouth each bedtime as needed due to sedation 15 tablet 0  . Misc Natural Products (PROSTATE HEALTH) CAPS Take by mouth daily.    . predniSONE (DELTASONE) 20 MG tablet Take 2.5 PO QAM x1day, 2 PO QAM x1day, 1.5 PO QAM x1day, 1 PO QAM x1day (Patient not taking: Reported on 04/04/2018) 7 tablet 0   No facility-administered medications prior to visit.     PAST MEDICAL HISTORY: Past Medical History:  Diagnosis Date  . Arthritis    hands   . High cholesterol   . Hypertension   . Seizure ([place])    last sz 2001    PAST SURGICAL HISTORY: Past Surgical History:  Procedure Laterality Date  . COLONOSCOPY  10/15/2007  . None      FAMILY HISTORY: Family History  Problem Relation Age of Onset  . Leukemia Mother   . Cancer Mother   . Heart attack Brother        2 in 6 months   . Hyperlipidemia Brother   . Cancer Sister        breast cancer  . Hyperlipidemia Sister   . Breast cancer Sister   . Cancer Father        bladder cancer  . Cancer Maternal Grandfather        lung cancer  . Cancer Paternal Grandfather        melanoma  . Hyperlipidemia Sister   . Colon cancer Neg Hx   . Colon polyps Neg Hx   . Esophageal  cancer Neg Hx   . Rectal cancer Neg Hx   . Stomach cancer Neg Hx     SOCIAL HISTORY: Social History   Socioeconomic History  . Marital status: Married    Spouse name: [name]  . Number of children: Not on file  . Years of education: Not on file  . Highest education level: Not on file  Occupational History  . Occupation: Self Empolyed  Social Needs  . Financial resource strain: Not on file  . Food insecurity:    Worry: Not on file    Inability: Not on file  . Transportation needs:    Medical: Not on file    Non-medical: Not on file  Tobacco Use  . Smoking status: Never Smoker  . Smokeless tobacco: Never Used  Substance and Sexual Activity  . Alcohol use: No  . Drug use: Yes     Types: "Crack" cocaine  . Sexual activity: Yes  Lifestyle  . Physical activity:    Days per week: Not on file    Minutes per session: Not on file  . Stress: Not on file  Relationships  . Social connections:    Talks on phone: Not on file    Gets together: Not on file    Attends religious service: Not on file    Active member of club or organization: Not on file    Attends meetings of clubs or organizations: Not on file    Relationship status: Not on file  . Intimate partner violence:    Fear of current or ex partner: Not on file    Emotionally abused: Not on file    Physically abused: Not on file    Forced sexual activity: Not on file  Other Topics Concern  . Not on file  Social History Narrative   Patient owns a [employer].   Patient is Married to [place].         PHYSICAL EXAM  Vitals:   07/30/18 1101  BP: (!) 150/84  Pulse: 65  Weight: 181 lb 6.4 oz (82.3 kg)  Height: 5' 10.25" (1.784 m)   Body mass index is 25.84 kg/m. General: well developed, well nourished, seated, in no evident distress  Head: head normocephalic and atraumatic. Oropharynx benign  Neck: supple  Neurologic Exam  Mental Status: Awake and fully alert. Oriented to place and time. Follows all commands. Speech and language normal.  Cranial Nerves: Pupils equal, briskly reactive to light. Extraocular movements full without nystagmus. Visual fields full to confrontation. Hearing intact and symmetric to finger snap. Facial sensation intact. Face, tongue, palate move normally and symmetrically. Neck flexion and extension normal.  Motor: Normal bulk and tone. Normal strength in all tested extremity muscles.No focal weakness  Coordination: Rapid alternating movements normal in all extremities. Finger-to-nose and heel-to-shin performed accurately bilaterally.  Gait and Station: Arises from chair without difficulty. Stance is normal. Gait demonstrates normal stride length and balance .  Able to heel, toe and tandem walk without difficulty.  Reflexes: 2+ and symmetric. Toes downgoing.   DIAGNOSTIC DATA (LABS, IMAGING, TESTING) - Lab Results  Component Value Date   CHOL 236 (H) 04/12/2017   HDL 83 04/12/2017   LDLCALC 140 (H) 04/12/2017   TRIG 64 04/12/2017   CHOLHDL 2.8 04/12/2017   ASSESSMENT AND PLAN  62 y.o. year old male  has a past medical history of Seizure;here to follow-up . Last seizure event occurred in 2001.   Obtain  CBC CMP to monitor adverse effects  of Epitol Renew medication (Epitol ) Call for any seizure activity Followup yearly and when necessary [name], [facility], [facility], [facility] [address], [place], [place] [address] [phone]

## 2018-07-30 ENCOUNTER — Ambulatory Visit (INDEPENDENT_AMBULATORY_CARE_PROVIDER_SITE_OTHER): Payer: BLUE CROSS/BLUE SHIELD | Admitting: Nurse Practitioner

## 2018-07-30 ENCOUNTER — Encounter: Payer: Self-pay | Admitting: Nurse Practitioner

## 2018-07-30 VITALS — BP 150/84 | HR 65 | Ht 70.25 in | Wt 181.4 lb

## 2018-07-30 DIAGNOSIS — G40109 Localization-related (focal) (partial) symptomatic epilepsy and epileptic syndromes with simple partial seizures, not intractable, without status epilepticus: Secondary | ICD-10-CM

## 2018-07-30 DIAGNOSIS — G40309 Generalized idiopathic epilepsy and epileptic syndromes, not intractable, without status epilepticus: Secondary | ICD-10-CM | POA: Diagnosis not present

## 2018-07-30 DIAGNOSIS — Z5181 Encounter for therapeutic drug level monitoring: Secondary | ICD-10-CM

## 2018-07-30 MED ORDER — CARBAMAZEPINE 200 MG PO TABS: 400.0000 mg | ORAL_TABLET | Freq: Every day | ORAL | 3 refills | Status: DC

## 2018-07-30 NOTE — Patient Instructions (Signed)
Obtain  CBC CMP  Renew medication (Epitol ) Call for any seizure activity Followup yearly and when necessary

## 2018-07-30 NOTE — Progress Notes (Signed)
I have read the note, and I agree with the clinical assessment and plan.  Halayna Blane K Toniette Devera   

## 2018-07-31 ENCOUNTER — Telehealth: Payer: Self-pay | Admitting: *Deleted

## 2018-07-31 LAB — CBC WITH DIFFERENTIAL/PLATELET
Basophils Absolute: 0 10*3/uL (ref 0.0–0.2)
Basos: 0 %
EOS (ABSOLUTE): 0.1 10*3/uL (ref 0.0–0.4)
Eos: 1 %
Hematocrit: 38 % (ref 37.5–51.0)
Hemoglobin: 12.8 g/dL — ABNORMAL LOW (ref 13.0–17.7)
Immature Grans (Abs): 0 10*3/uL (ref 0.0–0.1)
Immature Granulocytes: 0 %
Lymphocytes Absolute: 1.3 10*3/uL (ref 0.7–3.1)
Lymphs: 29 %
MCH: 31.1 pg (ref 26.6–33.0)
MCHC: 33.7 g/dL (ref 31.5–35.7)
MCV: 93 fL (ref 79–97)
Monocytes Absolute: 0.3 10*3/uL (ref 0.1–0.9)
Monocytes: 7 %
Neutrophils Absolute: 2.9 10*3/uL (ref 1.4–7.0)
Neutrophils: 63 %
Platelets: 248 10*3/uL (ref 150–450)
RBC: 4.11 x10E6/uL — ABNORMAL LOW (ref 4.14–5.80)
RDW: 13.8 % (ref 12.3–15.4)
WBC: 4.6 10*3/uL (ref 3.4–10.8)

## 2018-07-31 LAB — COMPREHENSIVE METABOLIC PANEL
ALT: 21 IU/L (ref 0–44)
AST: 22 IU/L (ref 0–40)
Albumin/Globulin Ratio: 1.6 (ref 1.2–2.2)
Albumin: 4.5 g/dL (ref 3.6–4.8)
Alkaline Phosphatase: 90 IU/L (ref 39–117)
BUN/Creatinine Ratio: 21 (ref 10–24)
BUN: 21 mg/dL (ref 8–27)
Bilirubin Total: 0.2 mg/dL (ref 0.0–1.2)
CO2: 23 mmol/L (ref 20–29)
Calcium: 9.2 mg/dL (ref 8.6–10.2)
Chloride: 103 mmol/L (ref 96–106)
Creatinine, Ser: 1 mg/dL (ref 0.76–1.27)
GFR calc Af Amer: 93 mL/min/{1.73_m2} (ref >59)
GFR calc non Af Amer: 81 mL/min/{1.73_m2} (ref >59)
Globulin, Total: 2.8 g/dL (ref 1.5–4.5)
Glucose: 88 mg/dL (ref 65–99)
Potassium: 4 mmol/L (ref 3.5–5.2)
Sodium: 141 mmol/L (ref 134–144)
Total Protein: 7.3 g/dL (ref 6.0–8.5)

## 2018-07-31 NOTE — Telephone Encounter (Signed)
Called patient's home number, wife answered. She is on DPR. Informed her that his labs are stable. She then stated she doesn't see a bill for his copay. This RN advised he would have gotten that when he checked in. She stated she would call billing office to inquire. She verbalized understanding, appreciation of call.

## 2018-10-05 ENCOUNTER — Ambulatory Visit: Payer: BLUE CROSS/BLUE SHIELD | Admitting: Emergency Medicine

## 2018-10-22 ENCOUNTER — Ambulatory Visit: Payer: Self-pay | Admitting: Emergency Medicine

## 2018-11-06 ENCOUNTER — Other Ambulatory Visit: Payer: Self-pay

## 2018-11-06 ENCOUNTER — Encounter: Payer: Self-pay | Admitting: Emergency Medicine

## 2018-11-06 ENCOUNTER — Ambulatory Visit: Payer: BLUE CROSS/BLUE SHIELD | Admitting: Emergency Medicine

## 2018-11-06 VITALS — BP 121/60 | HR 59 | Temp 98.5°F | Resp 16 | Ht 70.25 in | Wt 178.8 lb

## 2018-11-06 DIAGNOSIS — I10 Essential (primary) hypertension: Secondary | ICD-10-CM | POA: Diagnosis not present

## 2018-11-06 DIAGNOSIS — E785 Hyperlipidemia, unspecified: Secondary | ICD-10-CM

## 2018-11-06 DIAGNOSIS — Z1159 Encounter for screening for other viral diseases: Secondary | ICD-10-CM

## 2018-11-06 MED ORDER — AMLODIPINE BESYLATE 5 MG PO TABS: 5.0000 mg | ORAL_TABLET | Freq: Every day | ORAL | 3 refills | Status: DC

## 2018-11-06 NOTE — Assessment & Plan Note (Signed)
Blood pressure under control.  Patient tolerating amlodipine very well.  No change in treatment.  Labs reviewed with patient.  Normal electrolytes and normal kidney function test.  Liver enzymes normal.  Follow-up in 6 months.

## 2018-11-06 NOTE — Patient Instructions (Addendum)
   If you have lab work done today you will be contacted with your lab results within the next 2 weeks.  If you have not heard from us then please contact us. The fastest way to get your results is to register for My Chart.   IF you received an x-ray today, you will receive an invoice from [facility]. Please contact [facility] at [phone] with questions or concerns regarding your invoice.   IF you received labwork today, you will receive an invoice from [facility]. Please contact [facility] at [phone] with questions or concerns regarding your invoice.   Our billing staff will not be able to assist you with questions regarding bills from these companies.  You will be contacted with the lab results as soon as they are available. The fastest way to get your results is to activate your My Chart account. Instructions are located on the last page of this paperwork. If you have not heard from us regarding the results in 2 weeks, please contact this office.     Hypertension Hypertension, commonly called high blood pressure, is when the force of blood pumping through the arteries is too strong. The arteries are the blood vessels that carry blood from the heart throughout the body. Hypertension forces the heart to work harder to pump blood and may cause arteries to become narrow or stiff. Having untreated or uncontrolled hypertension can cause heart attacks, strokes, kidney disease, and other problems. A blood pressure reading consists of a higher number over a lower number. Ideally, your blood pressure should be below 120/80. The first ("top") number is called the systolic pressure. It is a measure of the pressure in your arteries as your heart beats. The second ("bottom") number is called the diastolic pressure. It is a measure of the pressure in your arteries as the heart relaxes. What are the causes? The cause of this condition is not known. What increases the risk? Some  risk factors for high blood pressure are under your control. Others are not. Factors you can change  Smoking.  Having type 2 diabetes mellitus, high cholesterol, or both.  Not getting enough exercise or physical activity.  Being overweight.  Having too much fat, sugar, calories, or salt (sodium) in your diet.  Drinking too much alcohol. Factors that are difficult or impossible to change  Having chronic kidney disease.  Having a family history of high blood pressure.  Age. Risk increases with age.  Race. You may be at higher risk if you are African-American.  Gender. Men are at higher risk than women before age 45. After age 65, women are at higher risk than men.  Having obstructive sleep apnea.  Stress. What are the signs or symptoms? Extremely high blood pressure (hypertensive crisis) may cause:  Headache.  Anxiety.  Shortness of breath.  Nosebleed.  Nausea and vomiting.  Severe chest pain.  Jerky movements you cannot control (seizures).  How is this diagnosed? This condition is diagnosed by measuring your blood pressure while you are seated, with your arm resting on a surface. The cuff of the blood pressure monitor will be placed directly against the skin of your upper arm at the level of your heart. It should be measured at least twice using the same arm. Certain conditions can cause a difference in blood pressure between your right and left arms. Certain factors can cause blood pressure readings to be lower or higher than normal (elevated) for a short period of time:  When   your blood pressure is higher when you are in a health care provider's office than when you are at home, this is called white coat hypertension. Most people with this condition do not need medicines.  When your blood pressure is higher at home than when you are in a health care provider's office, this is called masked hypertension. Most people with this condition may need medicines to control  blood pressure.  If you have a high blood pressure reading during one visit or you have normal blood pressure with other risk factors:  You may be asked to return on a different day to have your blood pressure checked again.  You may be asked to monitor your blood pressure at home for 1 week or longer.  If you are diagnosed with hypertension, you may have other blood or imaging tests to help your health care provider understand your overall risk for other conditions. How is this treated? This condition is treated by making healthy lifestyle changes, such as eating healthy foods, exercising more, and reducing your alcohol intake. Your health care provider may prescribe medicine if lifestyle changes are not enough to get your blood pressure under control, and if:  Your systolic blood pressure is above 130.  Your diastolic blood pressure is above 80.  Your personal target blood pressure may vary depending on your medical conditions, your age, and other factors. Follow these instructions at home: Eating and drinking  Eat a diet that is high in fiber and potassium, and low in sodium, added sugar, and fat. An example eating plan is called the DASH (Dietary Approaches to Stop Hypertension) diet. To eat this way: ? Eat plenty of fresh fruits and vegetables. Try to fill half of your plate at each meal with fruits and vegetables. ? Eat whole grains, such as whole wheat pasta, brown rice, or whole grain bread. Fill about one quarter of your plate with whole grains. ? Eat or drink low-fat dairy products, such as skim milk or low-fat yogurt. ? Avoid fatty cuts of meat, processed or cured meats, and poultry with skin. Fill about one quarter of your plate with lean proteins, such as fish, chicken without skin, beans, eggs, and tofu. ? Avoid premade and processed foods. These tend to be higher in sodium, added sugar, and fat.  Reduce your daily sodium intake. Most people with hypertension should eat less  than 1,500 mg of sodium a day.  Limit alcohol intake to no more than 1 drink a day for nonpregnant women and 2 drinks a day for men. One drink equals 12 oz of beer, 5 oz of wine, or 1 oz of hard liquor. Lifestyle  Work with your health care provider to maintain a healthy body weight or to lose weight. Ask what an ideal weight is for you.  Get at least 30 minutes of exercise that causes your heart to beat faster (aerobic exercise) most days of the week. Activities may include walking, swimming, or biking.  Include exercise to strengthen your muscles (resistance exercise), such as pilates or lifting weights, as part of your weekly exercise routine. Try to do these types of exercises for 30 minutes at least 3 days a week.  Do not use any products that contain nicotine or tobacco, such as cigarettes and e-cigarettes. If you need help quitting, ask your health care provider.  Monitor your blood pressure at home as told by your health care provider.  Keep all follow-up visits as told by your health care provider.   This is important. Medicines  Take over-the-counter and prescription medicines only as told by your health care provider. Follow directions carefully. Blood pressure medicines must be taken as prescribed.  Do not skip doses of blood pressure medicine. Doing this puts you at risk for problems and can make the medicine less effective.  Ask your health care provider about side effects or reactions to medicines that you should watch for. Contact a health care provider if:  You think you are having a reaction to a medicine you are taking.  You have headaches that keep coming back (recurring).  You feel dizzy.  You have swelling in your ankles.  You have trouble with your vision. Get help right away if:  You develop a severe headache or confusion.  You have unusual weakness or numbness.  You feel faint.  You have severe pain in your chest or abdomen.  You vomit  repeatedly.  You have trouble breathing. Summary  Hypertension is when the force of blood pumping through your arteries is too strong. If this condition is not controlled, it may put you at risk for serious complications.  Your personal target blood pressure may vary depending on your medical conditions, your age, and other factors. For most people, a normal blood pressure is less than 120/80.  Hypertension is treated with lifestyle changes, medicines, or a combination of both. Lifestyle changes include weight loss, eating a healthy, low-sodium diet, exercising more, and limiting alcohol. This information is not intended to replace advice given to you by your health care provider. Make sure you discuss any questions you have with your health care provider. Document Released: 12/12/2005 Document Revised: 11/09/2016 Document Reviewed: 11/09/2016 [employer] Interactive Patient Education  2018 [employer]  

## 2018-11-06 NOTE — Progress Notes (Signed)
[name] 62 y.o.   Chief Complaint  Patient presents with  . Hypertension    per patient follow up blood pressure and medication    HISTORY OF PRESENT ILLNESS: This is a 62 y.o. male with history of hypertension here for follow-up and medication refill.  Doing well.  Has no complaints. Also has a history of seizure disorder on Tegretol, saw neurologist 3 months ago and everything was okay.  Had CBC and CMP done.  Results are unremarkable.  BP Readings from Last 3 Encounters:  11/06/18 121/60  07/30/18 (!) 150/84  04/04/18 130/82   Wt Readings from Last 3 Encounters:  11/06/18 178 lb 12.8 oz (81.1 kg)  07/30/18 181 lb 6.4 oz (82.3 kg)  04/04/18 175 lb 6.4 oz (79.6 kg)    HPI   Prior to Admission medications   Medication Sig Start Date End Date Taking? Authorizing Provider  amLODipine (NORVASC) 5 MG tablet Take 1 tablet (5 mg total) by mouth daily. 04/04/18  Yes Rashon Rezek, [name], MD  carbamazepine (EPITOL) 200 MG tablet Take 2 tablets (400 mg total) by mouth at bedtime. 07/30/18   [name], NP  co-enzyme Q-10 30 MG capsule Take 30 mg by mouth daily.    [provider]  GARLIC PO Take by mouth daily.    [provider]  glucosamine-chondroitin 500-400 MG tablet Take 1 tablet by mouth daily.    [provider]  Omega-3 Fatty Acids (FISH OIL) 1000 MG CAPS Take by mouth daily.    [provider]  UNABLE TO FIND Med Name: [name] Blood Pressure Supplement    [provider]    No Known Allergies  Patient Active Problem List   Diagnosis Date Noted  . Cough due to ACE inhibitor 04/04/2018  . Essential hypertension 09/05/2017  . Encounter for therapeutic drug monitoring 08/01/2016  . Localization-related focal epilepsy with simple partial seizures ([place]) 08/05/2013  . Generalized convulsive epilepsy ([place]) 08/05/2013    Past Medical History:  Diagnosis Date  . Arthritis    hands   . High  cholesterol   . Hypertension   . Seizure ([place])    last sz 2001    Past Surgical History:  Procedure Laterality Date  . COLONOSCOPY  10/15/2007  . None      Social History   Socioeconomic History  . Marital status: Married    Spouse name: [name]  . Number of children: Not on file  . Years of education: Not on file  . Highest education level: Not on file  Occupational History  . Occupation: Self Empolyed  Social Needs  . Financial resource strain: Not on file  . Food insecurity:    Worry: Not on file    Inability: Not on file  . Transportation needs:    Medical: Not on file    Non-medical: Not on file  Tobacco Use  . Smoking status: Never Smoker  . Smokeless tobacco: Never Used  Substance and Sexual Activity  . Alcohol use: No  . Drug use: Yes    Types: "Crack" cocaine  . Sexual activity: Yes  Lifestyle  . Physical activity:    Days per week: Not on file    Minutes per session: Not on file  . Stress: Not on file  Relationships  . Social connections:    Talks on phone: Not on file    Gets together: Not on file    Attends religious service: Not on file  Active member of club or organization: Not on file    Attends meetings of clubs or organizations: Not on file    Relationship status: Not on file  . Intimate partner violence:    Fear of current or ex partner: Not on file    Emotionally abused: Not on file    Physically abused: Not on file    Forced sexual activity: Not on file  Other Topics Concern  . Not on file  Social History Narrative   Patient owns a [employer].   Patient is Married to Celina.        Family History  Problem Relation Age of Onset  . Leukemia Mother   . Cancer Mother   . Heart attack Brother        2 in 6 months   . Hyperlipidemia Brother   . Cancer Sister        breast cancer  . Hyperlipidemia Sister   . Breast cancer Sister   . Cancer Father        bladder cancer  . Cancer Maternal Grandfather         lung cancer  . Cancer Paternal Grandfather        melanoma  . Hyperlipidemia Sister   . Colon cancer Neg Hx   . Colon polyps Neg Hx   . Esophageal cancer Neg Hx   . Rectal cancer Neg Hx   . Stomach cancer Neg Hx      Review of Systems  Constitutional: Negative.  Negative for chills and fever.  HENT: Negative.  Negative for hearing loss and sore throat.   Eyes: Negative.  Negative for blurred vision and double vision.  Respiratory: Negative.  Negative for cough and shortness of breath.   Cardiovascular: Negative.  Negative for chest pain and palpitations.  Gastrointestinal: Negative.  Negative for abdominal pain, diarrhea, nausea and vomiting.  Genitourinary: Negative.   Musculoskeletal: Negative.  Negative for back pain, joint pain, myalgias and neck pain.  Skin: Negative.  Negative for rash.  Neurological: Negative.  Negative for dizziness and headaches.  Endo/Heme/Allergies: Negative.   All other systems reviewed and are negative.   Vitals:   11/06/18 1147  BP: 121/60  Pulse: (!) 59  Resp: 16  Temp: 98.5 F (36.9 C)  SpO2: 98%    Physical Exam  Constitutional: He is oriented to person, place, and time. He appears well-developed and well-nourished.  HENT:  Head: Normocephalic and atraumatic.  Nose: Nose normal.  Mouth/Throat: Oropharynx is clear and moist.  Eyes: Pupils are equal, round, and reactive to light. Conjunctivae and EOM are normal.  Neck: Normal range of motion. Neck supple. No thyromegaly present.  Cardiovascular: Normal rate, regular rhythm and normal heart sounds.  Pulmonary/Chest: Effort normal and breath sounds normal.  Musculoskeletal: Normal range of motion.  Lymphadenopathy:    He has no cervical adenopathy.  Neurological: He is alert and oriented to person, place, and time. No sensory deficit. He exhibits normal muscle tone. Coordination normal.  Skin: Skin is warm and dry. Capillary refill takes less than 2 seconds.  Psychiatric: He has a  normal mood and affect. His behavior is normal.  Vitals reviewed.    ASSESSMENT & PLAN: Essential hypertension Blood pressure under control.  Patient tolerating amlodipine very well.  No change in treatment.  Labs reviewed with patient.  Normal electrolytes and normal kidney function test.  Liver enzymes normal.  Follow-up in 6 months.   Shane Phillips was seen today for hypertension.  Diagnoses and all orders for this visit:  Essential hypertension -     amLODipine (NORVASC) 5 MG tablet; Take 1 tablet (5 mg total) by mouth daily.  Need for hepatitis C screening test -     Hepatitis C antibody  Dyslipidemia -     Lipid panel    Patient Instructions       If you have lab work done today you will be contacted with your lab results within the next 2 weeks.  If you have not heard from [place] then please contact us. The fastest way to get your results is to register for My Chart.   IF you received an x-ray today, you will receive an invoice from [facility]. Please contact [facility] at [phone] with questions or concerns regarding your invoice.   IF you received labwork today, you will receive an invoice from [facility]. Please contact [facility] at [phone] with questions or concerns regarding your invoice.   Our billing staff will not be able to assist you with questions regarding bills from these companies.  You will be contacted with the lab results as soon as they are available. The fastest way to get your results is to activate your My Chart account. Instructions are located on the last page of this paperwork. If you have not heard from [place] regarding the results in 2 weeks, please contact this office.     Hypertension Hypertension, commonly called high blood pressure, is when the force of blood pumping through the arteries is too strong. The arteries are the blood vessels that carry blood from the heart throughout the body. Hypertension forces the heart to work  harder to pump blood and may cause arteries to become narrow or stiff. Having untreated or uncontrolled hypertension can cause heart attacks, strokes, kidney disease, and other problems. A blood pressure reading consists of a higher number over a lower number. Ideally, your blood pressure should be below 120/80. The first ("top") number is called the systolic pressure. It is a measure of the pressure in your arteries as your heart beats. The second ("bottom") number is called the diastolic pressure. It is a measure of the pressure in your arteries as the heart relaxes. What are the causes? The cause of this condition is not known. What increases the risk? Some risk factors for high blood pressure are under your control. Others are not. Factors you can change  Smoking.  Having type 2 diabetes mellitus, high cholesterol, or both.  Not getting enough exercise or physical activity.  Being overweight.  Having too much fat, sugar, calories, or salt (sodium) in your diet.  Drinking too much alcohol. Factors that are difficult or impossible to change  Having chronic kidney disease.  Having a family history of high blood pressure.  Age. Risk increases with age.  Race. You may be at higher risk if you are African-American.  Gender. Men are at higher risk than women before age 41. After age 53, women are at higher risk than men.  Having obstructive sleep apnea.  Stress. What are the signs or symptoms? Extremely high blood pressure (hypertensive crisis) may cause:  Headache.  Anxiety.  Shortness of breath.  Nosebleed.  Nausea and vomiting.  Severe chest pain.  Jerky movements you cannot control (seizures).  How is this diagnosed? This condition is diagnosed by measuring your blood pressure while you are seated, with your arm resting on a surface. The cuff of the blood pressure monitor will be placed directly against  the skin of your upper arm at the level of your heart. It  should be measured at least twice using the same arm. Certain conditions can cause a difference in blood pressure between your right and left arms. Certain factors can cause blood pressure readings to be lower or higher than normal (elevated) for a short period of time:  When your blood pressure is higher when you are in a health care provider's office than when you are at home, this is called white coat hypertension. Most people with this condition do not need medicines.  When your blood pressure is higher at home than when you are in a health care provider's office, this is called masked hypertension. Most people with this condition may need medicines to control blood pressure.  If you have a high blood pressure reading during one visit or you have normal blood pressure with other risk factors:  You may be asked to return on a different day to have your blood pressure checked again.  You may be asked to monitor your blood pressure at home for 1 week or longer.  If you are diagnosed with hypertension, you may have other blood or imaging tests to help your health care provider understand your overall risk for other conditions. How is this treated? This condition is treated by making healthy lifestyle changes, such as eating healthy foods, exercising more, and reducing your alcohol intake. Your health care provider may prescribe medicine if lifestyle changes are not enough to get your blood pressure under control, and if:  Your systolic blood pressure is above 130.  Your diastolic blood pressure is above 80.  Your personal target blood pressure may vary depending on your medical conditions, your age, and other factors. Follow these instructions at home: Eating and drinking  Eat a diet that is high in fiber and potassium, and low in sodium, added sugar, and fat. An example eating plan is called the DASH (Dietary Approaches to Stop Hypertension) diet. To eat this way: ? Eat plenty of fresh  fruits and vegetables. Try to fill half of your plate at each meal with fruits and vegetables. ? Eat whole grains, such as whole wheat pasta, brown rice, or whole grain bread. Fill about one quarter of your plate with whole grains. ? Eat or drink low-fat dairy products, such as skim milk or low-fat yogurt. ? Avoid fatty cuts of meat, processed or cured meats, and poultry with skin. Fill about one quarter of your plate with lean proteins, such as fish, chicken without skin, beans, eggs, and tofu. ? Avoid premade and processed foods. These tend to be higher in sodium, added sugar, and fat.  Reduce your daily sodium intake. Most people with hypertension should eat less than 1,500 mg of sodium a day.  Limit alcohol intake to no more than 1 drink a day for nonpregnant women and 2 drinks a day for men. One drink equals 12 oz of beer, 5 oz of wine, or 1 oz of hard liquor. Lifestyle  Work with your health care provider to maintain a healthy body weight or to lose weight. Ask what an ideal weight is for you.  Get at least 30 minutes of exercise that causes your heart to beat faster (aerobic exercise) most days of the week. Activities may include walking, swimming, or biking.  Include exercise to strengthen your muscles (resistance exercise), such as pilates or lifting weights, as part of your weekly exercise routine. Try to do these types of exercises  for 30 minutes at least 3 days a week.  Do not use any products that contain nicotine or tobacco, such as cigarettes and e-cigarettes. If you need help quitting, ask your health care provider.  Monitor your blood pressure at home as told by your health care provider.  Keep all follow-up visits as told by your health care provider. This is important. Medicines  Take over-the-counter and prescription medicines only as told by your health care provider. Follow directions carefully. Blood pressure medicines must be taken as prescribed.  Do not skip doses  of blood pressure medicine. Doing this puts you at risk for problems and can make the medicine less effective.  Ask your health care provider about side effects or reactions to medicines that you should watch for. Contact a health care provider if:  You think you are having a reaction to a medicine you are taking.  You have headaches that keep coming back (recurring).  You feel dizzy.  You have swelling in your ankles.  You have trouble with your vision. Get help right away if:  You develop a severe headache or confusion.  You have unusual weakness or numbness.  You feel faint.  You have severe pain in your chest or abdomen.  You vomit repeatedly.  You have trouble breathing. Summary  Hypertension is when the force of blood pumping through your arteries is too strong. If this condition is not controlled, it may put you at risk for serious complications.  Your personal target blood pressure may vary depending on your medical conditions, your age, and other factors. For most people, a normal blood pressure is less than 120/80.  Hypertension is treated with lifestyle changes, medicines, or a combination of both. Lifestyle changes include weight loss, eating a healthy, low-sodium diet, exercising more, and limiting alcohol. This information is not intended to replace advice given to you by your health care provider. Make sure you discuss any questions you have with your health care provider. Document Released: 12/12/2005 Document Revised: 11/09/2016 Document Reviewed: 11/09/2016 Elsevier Interactive Patient Education  2018 [employer]      Agustina Caroli, MD [facility]

## 2018-11-07 ENCOUNTER — Other Ambulatory Visit: Payer: Self-pay | Admitting: Emergency Medicine

## 2018-11-07 ENCOUNTER — Encounter: Payer: Self-pay | Admitting: Radiology

## 2018-11-07 LAB — LIPID PANEL
Chol/HDL Ratio: 3.6 ratio (ref 0.0–5.0)
Cholesterol, Total: 253 mg/dL — ABNORMAL HIGH (ref 100–199)
HDL: 70 mg/dL (ref >39)
LDL Calculated: 171 mg/dL — ABNORMAL HIGH (ref 0–99)
Triglycerides: 62 mg/dL (ref 0–149)
VLDL Cholesterol Cal: 12 mg/dL (ref 5–40)

## 2018-11-07 LAB — HEPATITIS C ANTIBODY: Hep C Virus Ab: <0.1 s/co ratio (ref 0.0–0.9)

## 2018-11-07 MED ORDER — ROSUVASTATIN CALCIUM 20 MG PO TABS: 20.0000 mg | ORAL_TABLET | Freq: Every day | ORAL | 3 refills | Status: DC

## 2018-11-08 NOTE — Progress Notes (Signed)
Thanks

## 2018-11-26 ENCOUNTER — Ambulatory Visit (INDEPENDENT_AMBULATORY_CARE_PROVIDER_SITE_OTHER): Payer: BLUE CROSS/BLUE SHIELD | Admitting: Emergency Medicine

## 2018-11-26 ENCOUNTER — Encounter: Payer: Self-pay | Admitting: Emergency Medicine

## 2018-11-26 VITALS — BP 142/70 | HR 63 | Temp 97.8°F | Ht 70.5 in | Wt 178.2 lb

## 2018-11-26 DIAGNOSIS — E785 Hyperlipidemia, unspecified: Secondary | ICD-10-CM

## 2018-11-26 DIAGNOSIS — I10 Essential (primary) hypertension: Secondary | ICD-10-CM

## 2018-11-26 DIAGNOSIS — Z0000 Encounter for general adult medical examination without abnormal findings: Secondary | ICD-10-CM

## 2018-11-26 MED ORDER — ROSUVASTATIN CALCIUM 20 MG PO TABS: 20.0000 mg | ORAL_TABLET | Freq: Every day | ORAL | 3 refills | Status: DC

## 2018-11-26 NOTE — Progress Notes (Signed)
The 10-year ASCVD risk score Shane Phillips DC Brooke Bonito., et al., 2013) is: 14.9%   Values used to calculate the score:     Age: 62 years     Sex: Male     Is Non-Hispanic African American: No     Diabetic: No     Tobacco smoker: No     Systolic Blood Pressure: 606 mmHg     Is BP treated: Yes     HDL Cholesterol: 70 mg/dL     Total Cholesterol: 253 mg/dL Wt Readings from Last 3 Encounters:  11/26/18 178 lb 3.2 oz (80.8 kg)  11/06/18 178 lb 12.8 oz (81.1 kg)  07/30/18 181 lb 6.4 oz (82.3 kg)   BP Readings from Last 3 Encounters:  11/26/18 (!) 157/89  11/06/18 121/60  07/30/18 (!) 150/84   Shane Phillips 62 y.o.   Chief Complaint  Patient presents with  . Annual Exam    CPE    HISTORY OF PRESENT ILLNESS: This is a 62 y.o. male with history of hypertension and seizure disorder here today for his annual exam.  Has no complaints or medical concerns.  Seen by me on 11/06/2018 for hypertension follow-up.  Blood work showed increased total cholesterol and increased LDL cholesterol.  Has not started Crestor yet.  Concerned about side effects. Up-to-date with colonoscopy and vaccines. Very mild LUTS.  HPI   Prior to Admission medications   Medication Sig Start Date End Date Taking? Authorizing Provider  amLODipine (NORVASC) 5 MG tablet Take 1 tablet (5 mg total) by mouth daily. 11/06/18  Yes Anjolaoluwa Siguenza, [name], MD  carbamazepine (EPITOL) 200 MG tablet Take 2 tablets (400 mg total) by mouth at bedtime. 07/30/18  Yes [name], NP  co-enzyme Q-10 30 MG capsule Take 30 mg by mouth daily.   Yes [provider]  GARLIC PO Take by mouth daily.   Yes [provider]  glucosamine-chondroitin 500-400 MG tablet Take 1 tablet by mouth daily.   Yes [provider]  Omega-3 Fatty Acids (FISH OIL) 1000 MG CAPS Take by mouth daily.   Yes [provider]  UNABLE TO FIND Med Name: [name] Naturopathic Blood Pressure Supplement   Yes [provider]    rosuvastatin (CRESTOR) 20 MG tablet Take 1 tablet (20 mg total) by mouth daily. Patient not taking: Reported on 11/26/2018 11/07/18   Horald Pollen, MD    No Known Allergies  Patient Active Problem List   Diagnosis Date Noted  . Cough due to ACE inhibitor 04/04/2018  . Essential hypertension 09/05/2017  . Encounter for therapeutic drug monitoring 08/01/2016  . Localization-related focal epilepsy with simple partial seizures ([place]) 08/05/2013  . Generalized convulsive epilepsy ([place]) 08/05/2013    Past Medical History:  Diagnosis Date  . Arthritis    hands   . High cholesterol   . Hypertension   . Seizure ([place])    last sz 2001    Past Surgical History:  Procedure Laterality Date  . COLONOSCOPY  10/15/2007  . None      Social History   Socioeconomic History  . Marital status: Married    Spouse name: Shane Phillips  . Number of children: Not on file  . Years of education: Not on file  . Highest education level: Not on file  Occupational History  . Occupation: Self Empolyed  Social Needs  . Financial resource strain: Not on file  . Food insecurity:    Worry: Not on file    Inability: Not  on file  . Transportation needs:    Medical: Not on file    Non-medical: Not on file  Tobacco Use  . Smoking status: Never Smoker  . Smokeless tobacco: Never Used  Substance and Sexual Activity  . Alcohol use: No  . Drug use: Yes    Types: "Crack" cocaine  . Sexual activity: Yes  Lifestyle  . Physical activity:    Days per week: Not on file    Minutes per session: Not on file  . Stress: Not on file  Relationships  . Social connections:    Talks on phone: Not on file    Gets together: Not on file    Attends religious service: Not on file    Active member of club or organization: Not on file    Attends meetings of clubs or organizations: Not on file    Relationship status: Not on file  . Intimate partner violence:    Fear of current or ex partner: Not on file     Emotionally abused: Not on file    Physically abused: Not on file    Forced sexual activity: Not on file  Other Topics Concern  . Not on file  Social History Narrative   Patient owns a [employer].   Patient is Married to Shane Phillips.        Family History  Problem Relation Age of Onset  . Leukemia Mother   . Cancer Mother   . Heart attack Brother        2 in 6 months   . Hyperlipidemia Brother   . Cancer Sister        breast cancer  . Hyperlipidemia Sister   . Breast cancer Sister   . Cancer Father        bladder cancer  . Cancer Maternal Grandfather        lung cancer  . Cancer Paternal Grandfather        melanoma  . Hyperlipidemia Sister   . Colon cancer Neg Hx   . Colon polyps Neg Hx   . Esophageal cancer Neg Hx   . Rectal cancer Neg Hx   . Stomach cancer Neg Hx      Review of Systems  Constitutional: Negative.  Negative for chills and fever.  HENT: Negative.  Negative for hearing loss and sore throat.   Eyes: Negative.  Negative for blurred vision and double vision.  Respiratory: Negative.  Negative for cough, hemoptysis and shortness of breath.   Cardiovascular: Negative.  Negative for chest pain, palpitations and leg swelling.  Gastrointestinal: Negative.  Negative for abdominal pain, blood in stool, diarrhea, melena, nausea and vomiting.  Genitourinary: Negative.  Negative for dysuria and hematuria.       Less flow, occasional dribbling, nocturia 1-2 times a night.  Musculoskeletal: Negative.  Negative for back pain, joint pain, myalgias and neck pain.  Skin: Negative.  Negative for rash.  Neurological: Negative.  Negative for dizziness, sensory change, focal weakness and headaches.  Endo/Heme/Allergies: Negative.   All other systems reviewed and are negative.   Vitals:   11/26/18 0825 11/26/18 0834  BP: (!) 157/89 (!) 142/70  Pulse: 63   Temp: 97.8 F (36.6 C)   SpO2: 96%     Physical Exam  Constitutional: He is oriented to person,  place, and time. He appears well-developed and well-nourished.  HENT:  Head: Normocephalic and atraumatic.  Nose: Nose normal.  Mouth/Throat: Oropharynx is clear and moist.  Eyes: Pupils are  equal, round, and reactive to light. Conjunctivae and EOM are normal.  Neck: Normal range of motion. Neck supple. No JVD present. No thyromegaly present.  Cardiovascular: Normal rate, regular rhythm, normal heart sounds and intact distal pulses.  Pulmonary/Chest: Effort normal and breath sounds normal.  Abdominal: Soft. Bowel sounds are normal. He exhibits no distension. There is no tenderness.  Musculoskeletal: Normal range of motion. He exhibits no edema or tenderness.  Lymphadenopathy:    He has no cervical adenopathy.  Neurological: He is alert and oriented to person, place, and time. No sensory deficit. He exhibits normal muscle tone. Coordination normal.  Skin: Skin is warm and dry. Capillary refill takes less than 2 seconds.  Psychiatric: He has a normal mood and affect. His behavior is normal.  Vitals reviewed.    ASSESSMENT & PLAN: Harrel was seen today for annual exam.  Diagnoses and all orders for this visit:  Routine general medical examination at a health care facility  Hyperlipidemia, unspecified hyperlipidemia type -     rosuvastatin (CRESTOR) 20 MG tablet; Take 1 tablet (20 mg total) by mouth daily.  Essential hypertension    Patient Instructions       If you have lab work done today you will be contacted with your lab results within the next 2 weeks.  If you have not heard from [place] then please contact us. The fastest way to get your results is to register for My Chart.   IF you received an x-ray today, you will receive an invoice from [facility]. Please contact [facility] at [phone] with questions or concerns regarding your invoice.   IF you received labwork today, you will receive an invoice from [facility]. Please contact [facility] at [phone]  with questions or concerns regarding your invoice.   Our billing staff will not be able to assist you with questions regarding bills from these companies.  You will be contacted with the lab results as soon as they are available. The fastest way to get your results is to activate your My Chart account. Instructions are located on the last page of this paperwork. If you have not heard from [place] regarding the results in 2 weeks, please contact this office.       Health Maintenance, Male A healthy lifestyle and preventive care is important for your health and wellness. Ask your health care provider about what schedule of regular examinations is right for you. What should I know about weight and diet? Eat a Healthy Diet  Eat plenty of vegetables, fruits, whole grains, low-fat dairy products, and lean protein.  Do not eat a lot of foods high in solid fats, added sugars, or salt.  Maintain a Healthy Weight Regular exercise can help you achieve or maintain a healthy weight. You should:  Do at least 150 minutes of exercise each week. The exercise should increase your heart rate and make you sweat (moderate-intensity exercise).  Do strength-training exercises at least twice a week.  Watch Your Levels of Cholesterol and Blood Lipids  Have your blood tested for lipids and cholesterol every 5 years starting at 61 years of age. If you are at high risk for heart disease, you should start having your blood tested when you are 61 years old. You may need to have your cholesterol levels checked more often if: ? Your lipid or cholesterol levels are high. ? You are older than 61 years of age. ? You are at high risk for heart disease.  What should I  know about cancer screening? Many types of cancers can be detected early and may often be prevented. Lung Cancer  You should be screened every year for lung cancer if: ? You are a current smoker who has smoked for at least 30 years. ? You are a former  smoker who has quit within the past 15 years.  Talk to your health care provider about your screening options, when you should start screening, and how often you should be screened.  Colorectal Cancer  Routine colorectal cancer screening usually begins at 61 years of age and should be repeated every 5-10 years until you are 61 years old. You may need to be screened more often if early forms of precancerous polyps or small growths are found. Your health care provider may recommend screening at an earlier age if you have risk factors for colon cancer.  Your health care provider may recommend using home test kits to check for hidden blood in the stool.  A small camera at the end of a tube can be used to examine your colon (sigmoidoscopy or colonoscopy). This checks for the earliest forms of colorectal cancer.  Prostate and Testicular Cancer  Depending on your age and overall health, your health care provider may do certain tests to screen for prostate and testicular cancer.  Talk to your health care provider about any symptoms or concerns you have about testicular or prostate cancer.  Skin Cancer  Check your skin from head to toe regularly.  Tell your health care provider about any new moles or changes in moles, especially if: ? There is a change in a mole's size, shape, or color. ? You have a mole that is larger than a pencil eraser.  Always use sunscreen. Apply sunscreen liberally and repeat throughout the day.  Protect yourself by wearing long sleeves, pants, a wide-brimmed hat, and sunglasses when outside.  What should I know about heart disease, diabetes, and high blood pressure?  If you are 9-64 years of age, have your blood pressure checked every 3-5 years. If you are 32 years of age or older, have your blood pressure checked every year. You should have your blood pressure measured twice-once when you are at a hospital or clinic, and once when you are not at a hospital or clinic.  Record the average of the two measurements. To check your blood pressure when you are not at a hospital or clinic, you can use: ? An automated blood pressure machine at a pharmacy. ? A home blood pressure monitor.  Talk to your health care provider about your target blood pressure.  If you are between 49-56 years old, ask your health care provider if you should take aspirin to prevent heart disease.  Have regular diabetes screenings by checking your fasting blood sugar level. ? If you are at a normal weight and have a low risk for diabetes, have this test once every three years after the age of 78. ? If you are overweight and have a high risk for diabetes, consider being tested at a younger age or more often.  A one-time screening for abdominal aortic aneurysm (AAA) by ultrasound is recommended for men aged 25-75 years who are current or former smokers. What should I know about preventing infection? Hepatitis B If you have a higher risk for hepatitis B, you should be screened for this virus. Talk with your health care provider to find out if you are at risk for hepatitis B infection. Hepatitis C Blood testing  is recommended for:  Everyone born from [birth year] through 1964.  Anyone with known risk factors for hepatitis C.  Sexually Transmitted Diseases (STDs)  You should be screened each year for STDs including gonorrhea and chlamydia if: ? You are sexually active and are younger than 61 years of age. ? You are older than 60 years of age and your health care provider tells you that you are at risk for this type of infection. ? Your sexual activity has changed since you were last screened and you are at an increased risk for chlamydia or gonorrhea. Ask your health care provider if you are at risk.  Talk with your health care provider about whether you are at high risk of being infected with HIV. Your health care provider may recommend a prescription medicine to help prevent HIV  infection.  What else can I do?  Schedule regular health, dental, and eye exams.  Stay current with your vaccines (immunizations).  Do not use any tobacco products, such as cigarettes, chewing tobacco, and e-cigarettes. If you need help quitting, ask your health care provider.  Limit alcohol intake to no more than 2 drinks per day. One drink equals 12 ounces of beer, 5 ounces of wine, or 1 ounces of hard liquor.  Do not use street drugs.  Do not share needles.  Ask your health care provider for help if you need support or information about quitting drugs.  Tell your health care provider if you often feel depressed.  Tell your health care provider if you have ever been abused or do not feel safe at home. This information is not intended to replace advice given to you by your health care provider. Make sure you discuss any questions you have with your health care provider. Document Released: 06/09/2008 Document Revised: 08/10/2016 Document Reviewed: 09/15/2015 Elsevier Interactive Patient Education  2018 Elsevier Inc.      Agustina Caroli, MD [facility]

## 2018-11-26 NOTE — Patient Instructions (Addendum)

## 2019-02-08 IMAGING — DX DG FOOT COMPLETE 3+V*R*
3 series · 3 of 3 positions shown · non-contrast
Comparison: No prior .

CLINICAL DATA: Right foot pain. Prominent bony nodule. No reported
injury .

EXAM:
RIGHT FOOT COMPLETE - 3+ VIEW

[foot ap]
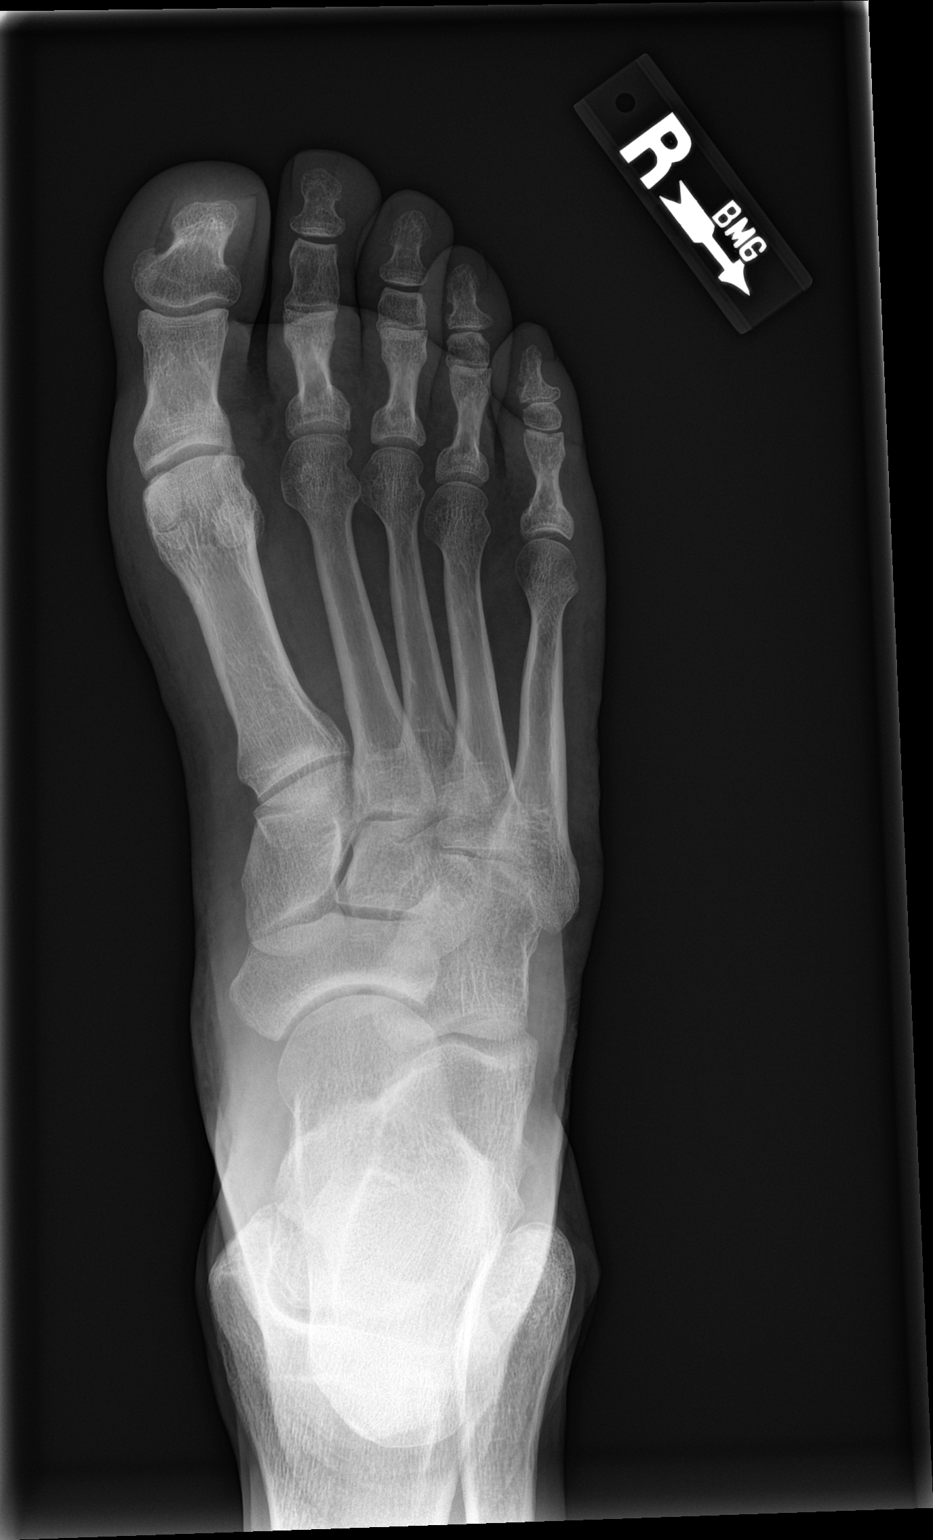

[foot obl]
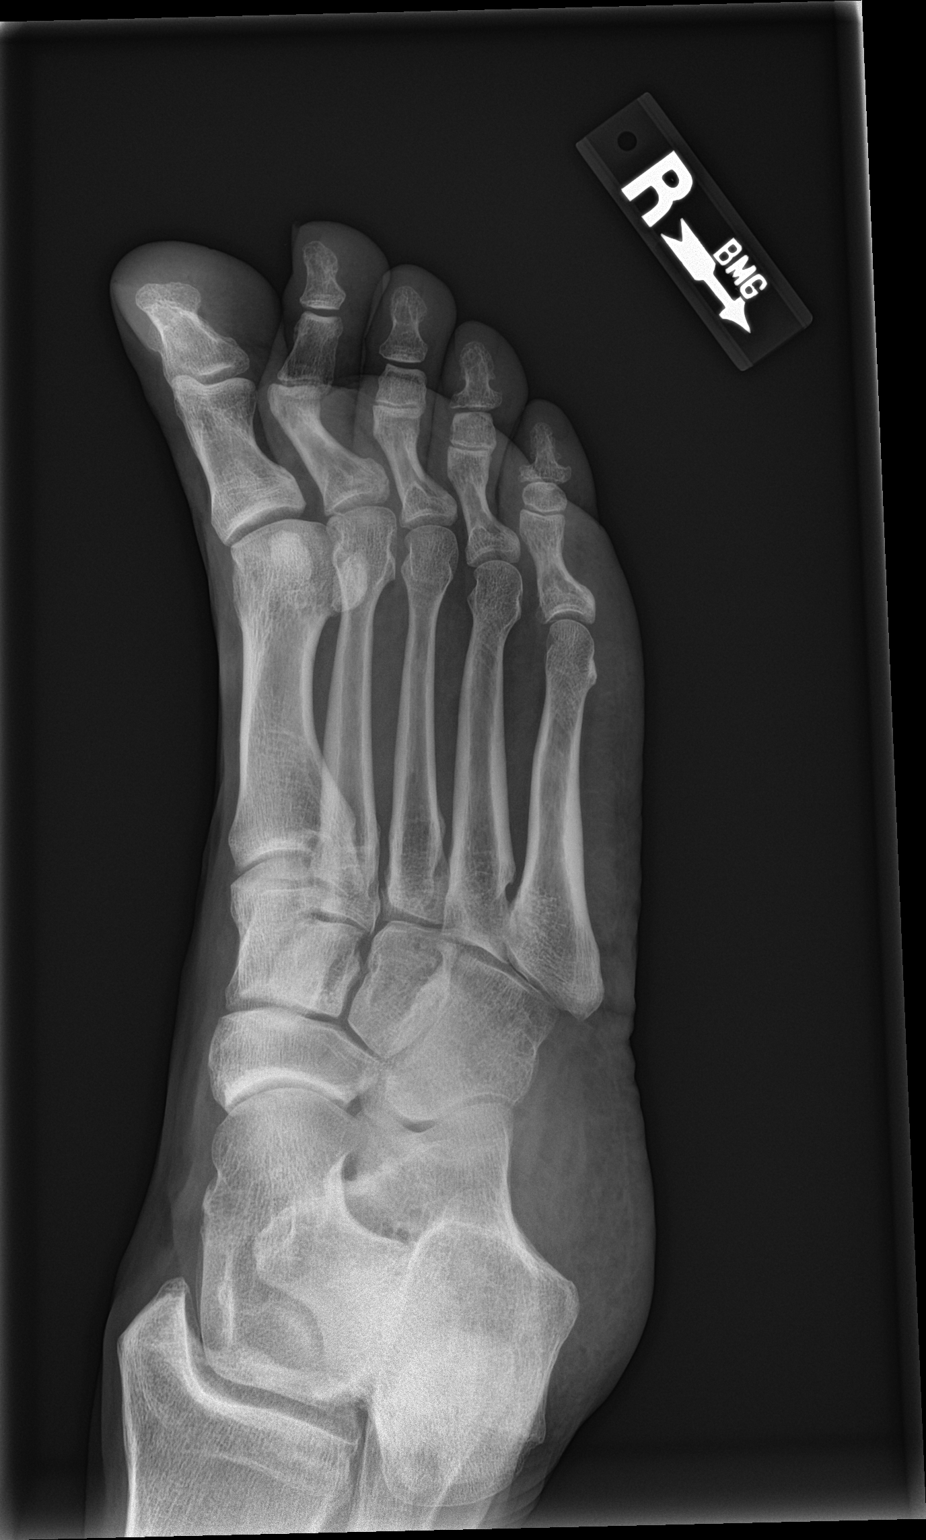

[foot lat]
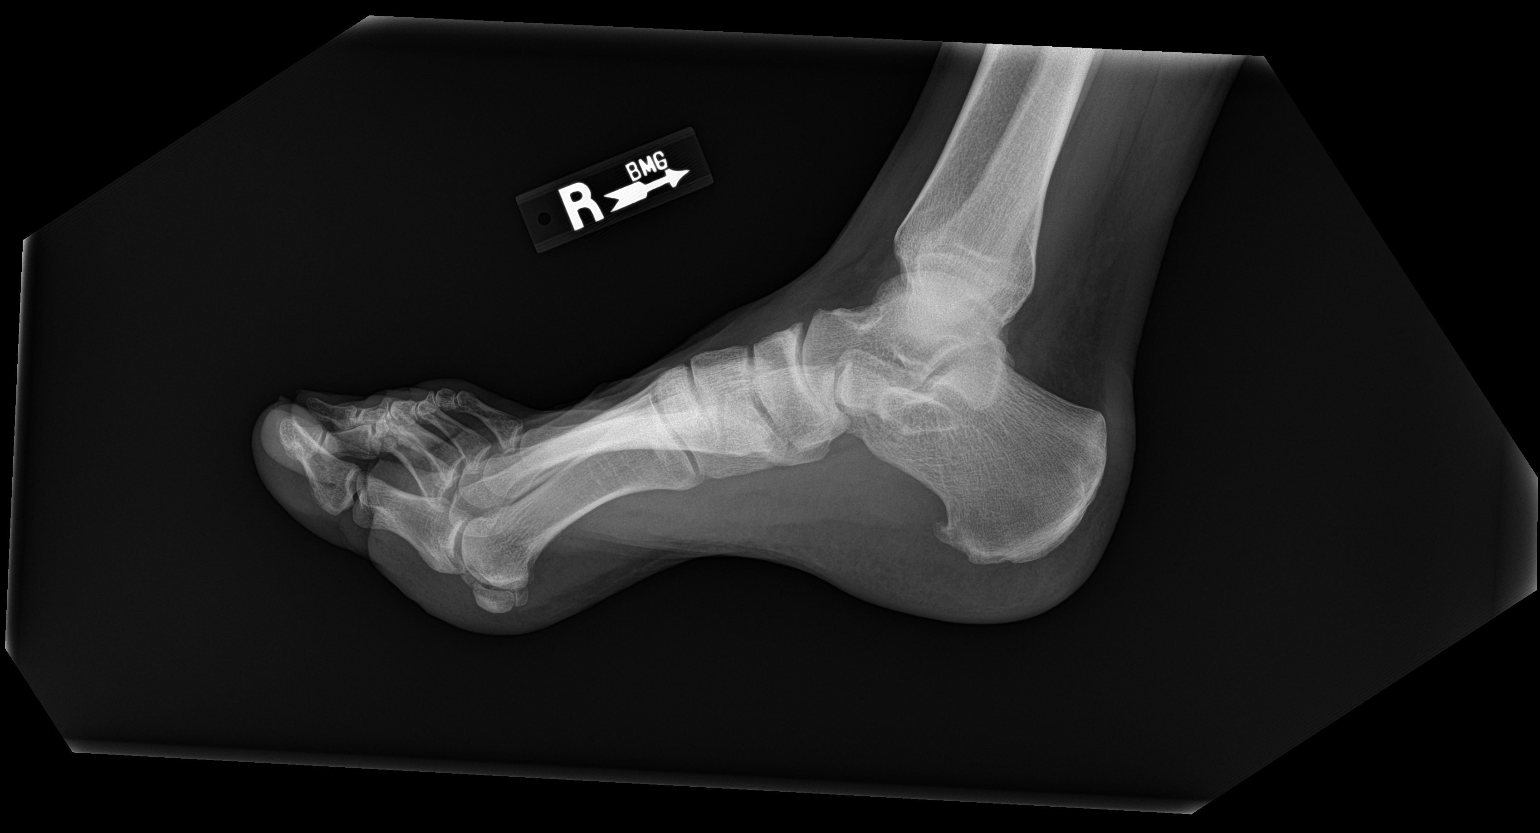

[3 of 3 positions shown; findings below may reference images not displayed]

FINDINGS: Mild degenerative change. No acute bony or joint abnormality
identified. No evidence of fracture dislocation.
IMPRESSION: Mild diffuse degenerative change.  No acute abnormality.

## 2019-02-08 IMAGING — DX DG SHOULDER 2+V*R*
3 series · 3 of 3 positions shown · non-contrast
Comparison: None.

CLINICAL DATA: Right shoulder pain for 1 year

EXAM:
RIGHT SHOULDER - 2+ VIEW

[shoulder ap]
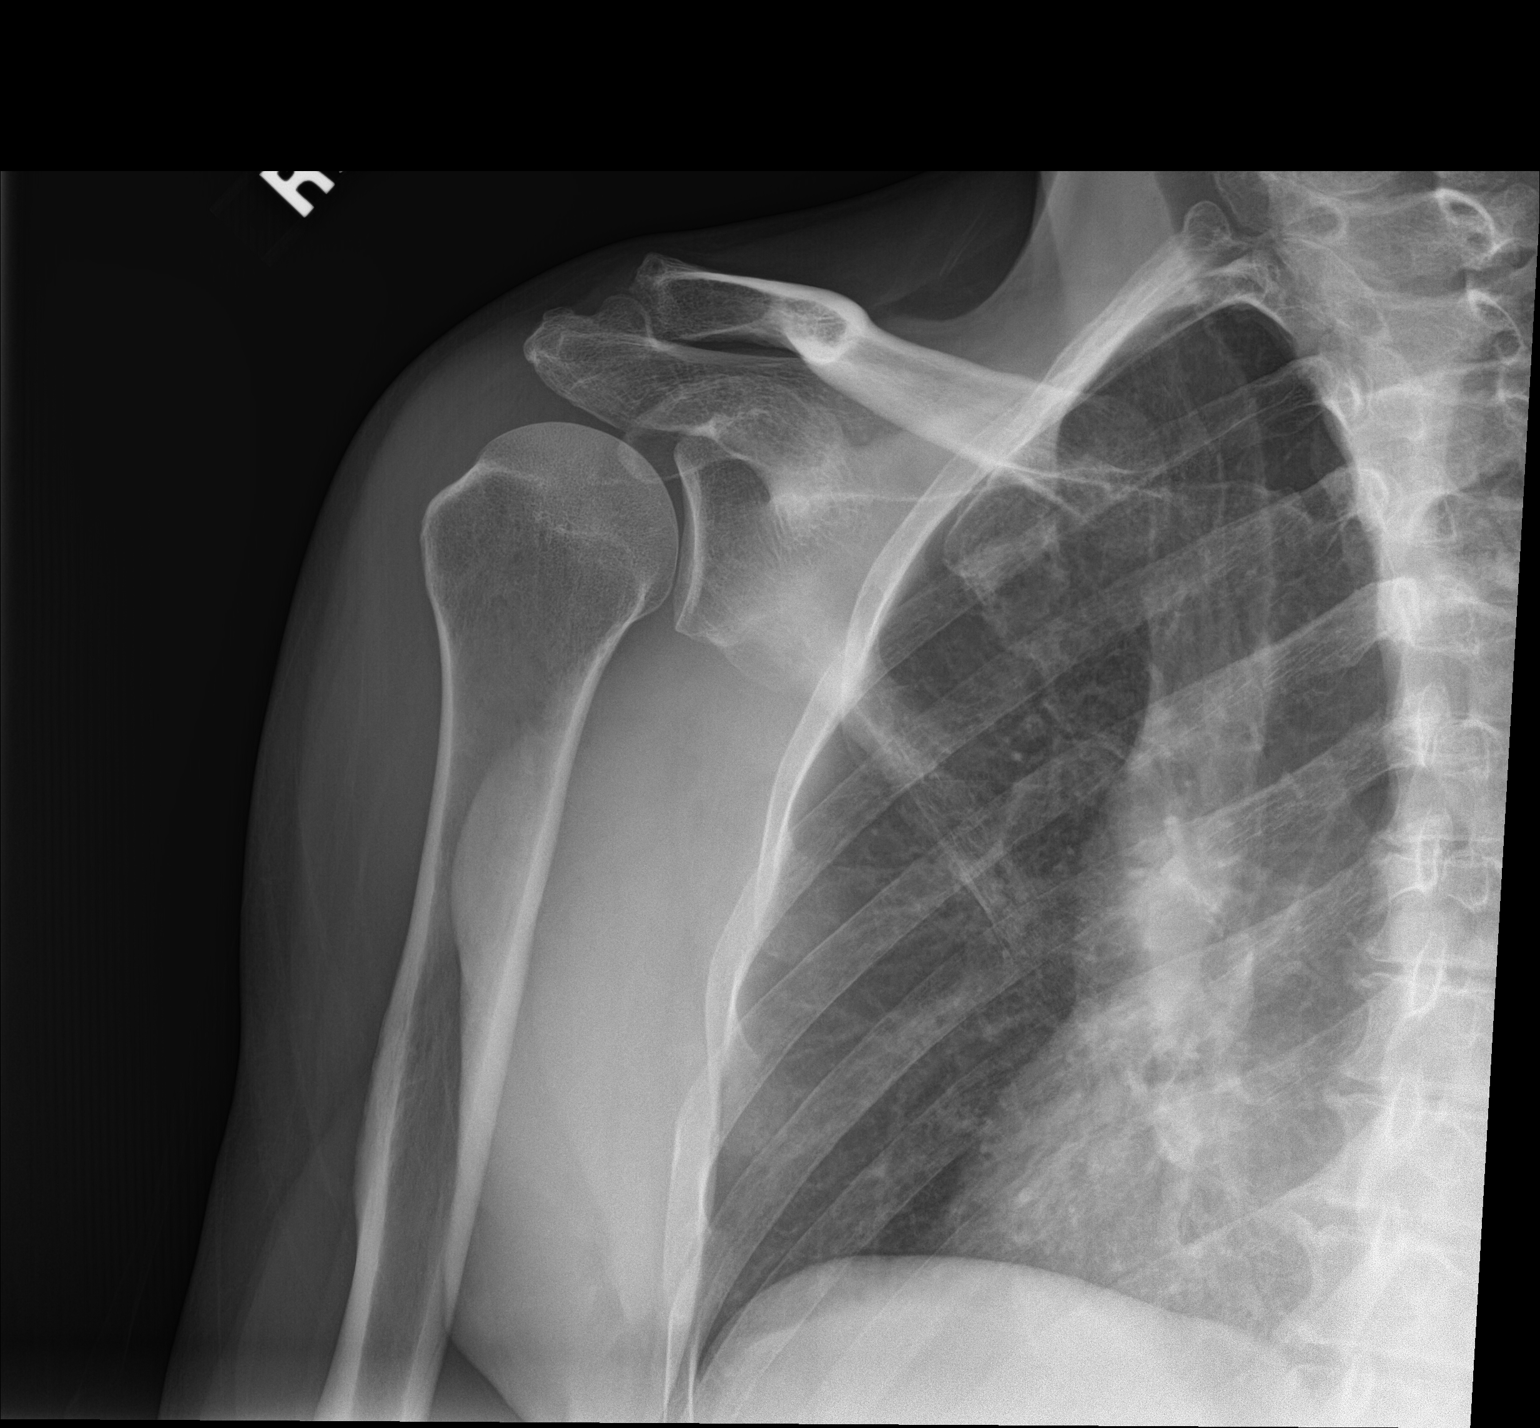

[shoulder axial]
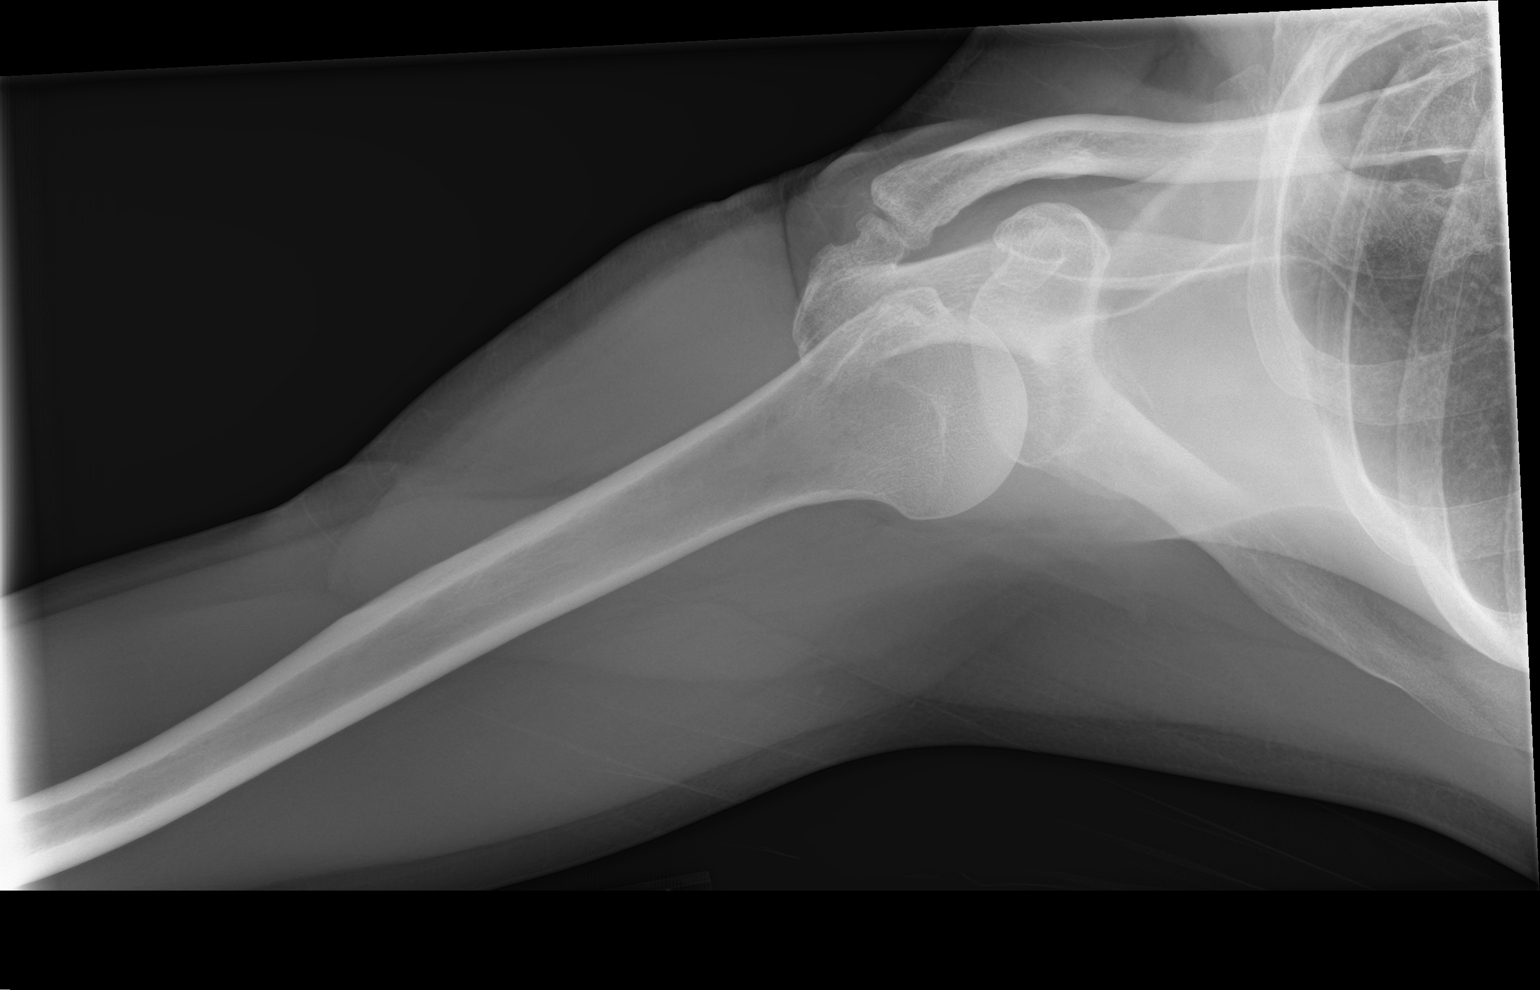

[shoulder y-view]
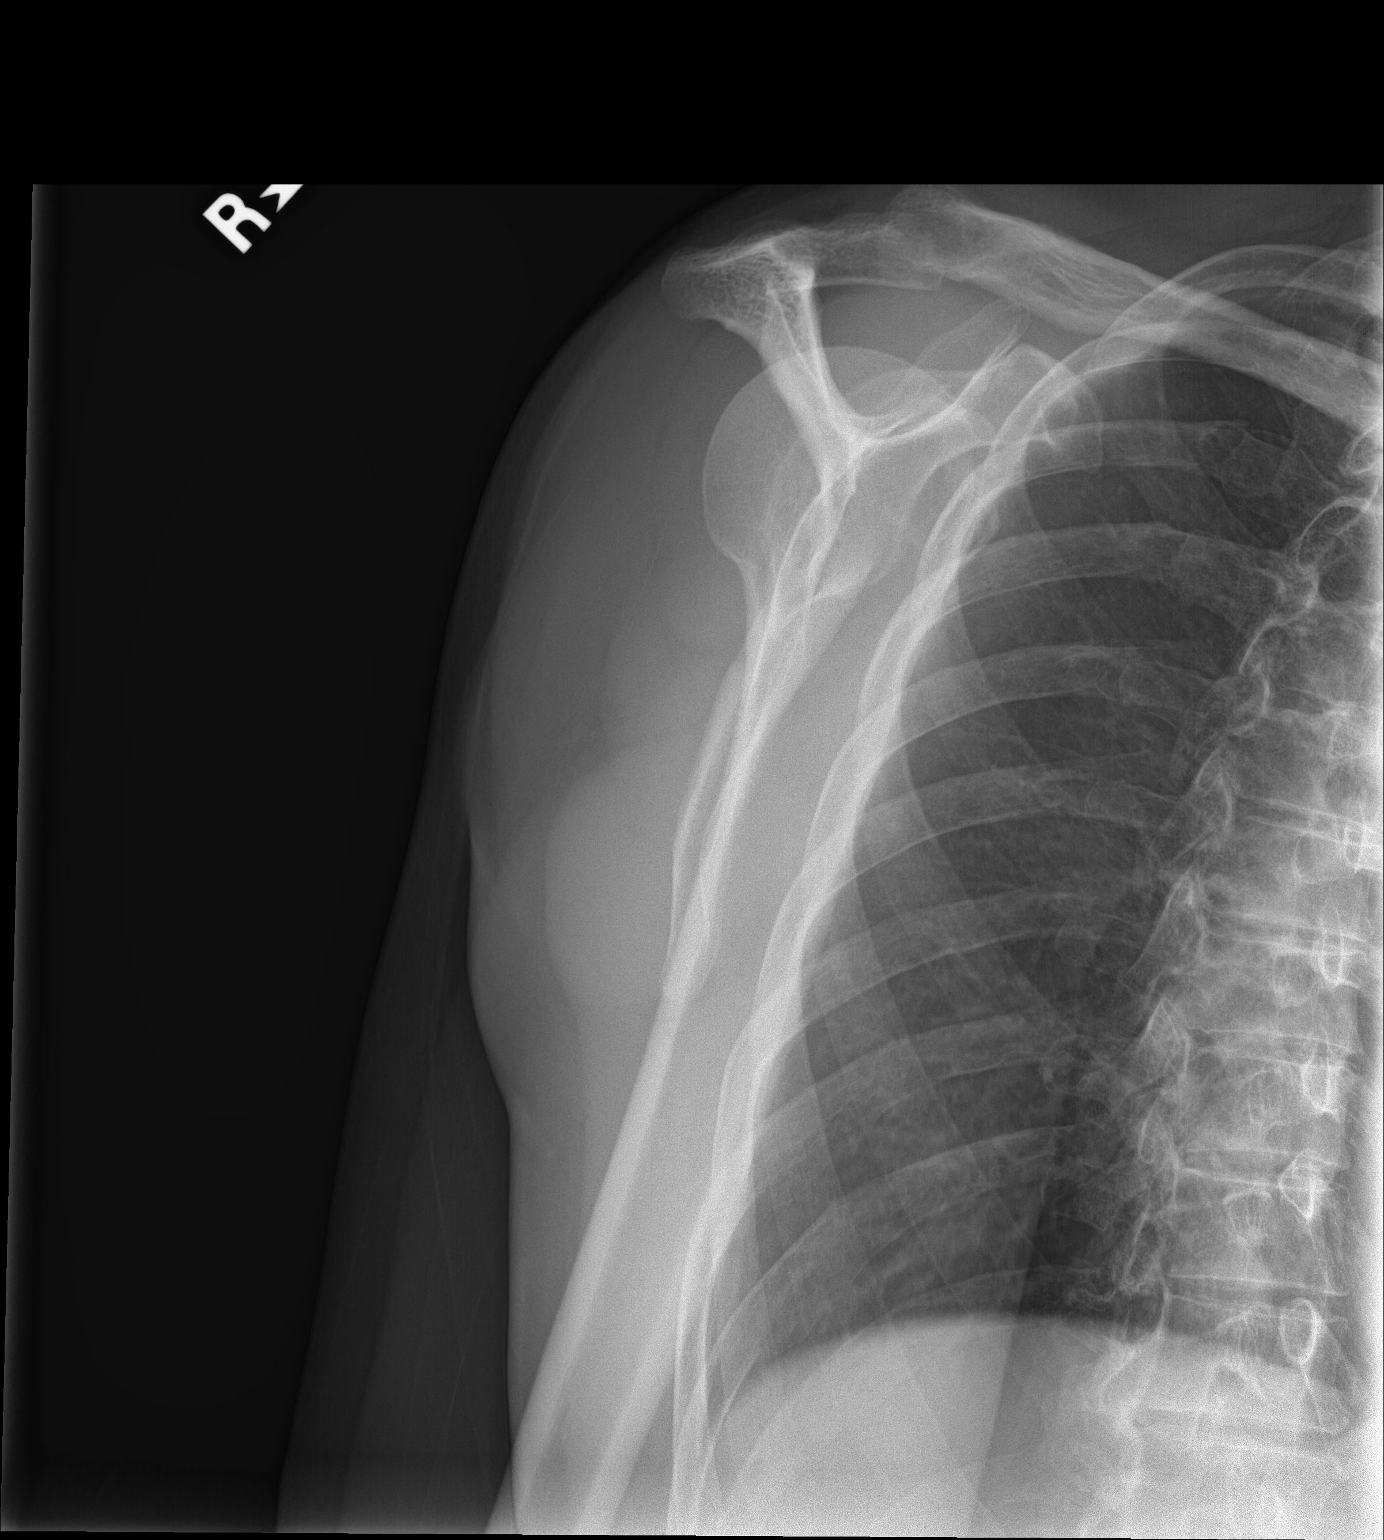

[3 of 3 positions shown; findings below may reference images not displayed]

FINDINGS: No acute fracture. No dislocation. Unremarkable soft tissues. Mild
degenerative change of the AC joint
IMPRESSION: No acute bony pathology.

## 2019-07-31 NOTE — Progress Notes (Signed)
PATIENT: Shane Phillips DOB: 1956/06/20  REASON FOR VISIT: follow up HISTORY FROM: patient  HISTORY OF PRESENT ILLNESS: Today 08/01/19 Shane Phillips is a 63 year old male with history of seizure disorder.  He is well controlled on carbamazepine.  He has had seizures since age 60.  His last seizure occurred in August 2001 after missing several doses of his medication. He has not had recurrent seizure. He is taking his medication regularly and tolerating it well. He does have high blood pressure. He works full-time, he owns his own [employer]. He does report for last several months has noticed the right posterior lateral heel may feel numb when it touches the sheets at night.  Overall, he has done quite well. He presents for follow-up unaccompanied.   HISTORY 07/30/2018 CM: Shane Phillips, 63 year-old white male returns today for followup. He has a history of seizure disorder generalized, is currently well controlled on carbamazepine. [facility]. EEG has demonstrated slowing in the right hemisphere, onset of seizures occurred at age 89, presumptive of prenatal difficulty. His last seizure occurred in August of 2001 after missing several doses of his medication. He denies any difficulty tolerating his medications, no difficulty with daytime drowsiness, no falls, no feelings of being off balance. He's had no visual difficulties, he has rare headache. No recent labs.  He needs refills on his medication   REVIEW OF SYSTEMS: Out of a complete 14 system review of symptoms, the patient complains only of the following symptoms, and all other reviewed systems are negative.  Seizures   ALLERGIES: No Known Allergies  HOME MEDICATIONS: Outpatient Medications Prior to Visit  Medication Sig Dispense Refill  . amLODipine (NORVASC) 5 MG tablet Take 1 tablet (5 mg total) by mouth daily. 90 tablet 3  . carbamazepine (EPITOL) 200 MG tablet Take 2 tablets (400 mg total) by mouth at  bedtime. 180 tablet 3  . co-enzyme Q-10 30 MG capsule Take 30 mg by mouth daily.    . rosuvastatin (CRESTOR) 20 MG tablet Take 1 tablet (20 mg total) by mouth daily. 90 tablet 3  . GARLIC PO Take by mouth daily.    Marland Kitchen glucosamine-chondroitin 500-400 MG tablet Take 1 tablet by mouth daily.    . Omega-3 Fatty Acids (FISH OIL) 1000 MG CAPS Take by mouth daily.    . rosuvastatin (CRESTOR) 20 MG tablet Take 1 tablet (20 mg total) by mouth daily. 90 tablet 3  . UNABLE TO FIND Med Name: [name] Naturopathic Blood Pressure Supplement     No facility-administered medications prior to visit.     PAST MEDICAL HISTORY: Past Medical History:  Diagnosis Date  . Arthritis    hands   . High cholesterol   . Hypertension   . Seizure ([place])    last sz 2001    PAST SURGICAL HISTORY: Past Surgical History:  Procedure Laterality Date  . COLONOSCOPY  10/15/2007  . None      FAMILY HISTORY: Family History  Problem Relation Age of Onset  . Leukemia Mother   . Cancer Mother   . Heart attack Brother        2 in 6 months   . Hyperlipidemia Brother   . Cancer Sister        breast cancer  . Hyperlipidemia Sister   . Breast cancer Sister   . Cancer Father        bladder cancer  . Cancer Maternal Grandfather  lung cancer  . Cancer Paternal Grandfather        melanoma  . Hyperlipidemia Sister   . Colon cancer Neg Hx   . Colon polyps Neg Hx   . Esophageal cancer Neg Hx   . Rectal cancer Neg Hx   . Stomach cancer Neg Hx     SOCIAL HISTORY: Social History   Socioeconomic History  . Marital status: Married    Spouse name: Shane Phillips  . Number of children: Not on file  . Years of education: Not on file  . Highest education level: Not on file  Occupational History  . Occupation: Self Empolyed  Social Needs  . Financial resource strain: Not on file  . Food insecurity    Worry: Not on file    Inability: Not on file  . Transportation needs    Medical: Not on file    Non-medical: Not  on file  Tobacco Use  . Smoking status: Never Smoker  . Smokeless tobacco: Never Used  Substance and Sexual Activity  . Alcohol use: No  . Drug use: Yes    Types: "Crack" cocaine  . Sexual activity: Yes  Lifestyle  . Physical activity    Days per week: Not on file    Minutes per session: Not on file  . Stress: Not on file  Relationships  . Social Herbalist on phone: Not on file    Gets together: Not on file    Attends religious service: Not on file    Active member of club or organization: Not on file    Attends meetings of clubs or organizations: Not on file    Relationship status: Not on file  . Intimate partner violence    Fear of current or ex partner: Not on file    Emotionally abused: Not on file    Physically abused: Not on file    Forced sexual activity: Not on file  Other Topics Concern  . Not on file  Social History Narrative   Patient owns a [employer].   Patient is Married to Shane Phillips.        PHYSICAL EXAM  There were no vitals filed for this visit. There is no height or weight on file to calculate BMI.  Generalized: Well developed, in no acute distress   Neurological examination  Mentation: Alert oriented to time, place, history taking. Follows all commands speech and language fluent Cranial nerve II-XII: Pupils were equal round reactive to light. Extraocular movements were full, visual field were full on confrontational test. Facial sensation and strength were normal. Head turning and shoulder shrug  were normal and symmetric. Motor: The motor testing reveals 5 over 5 strength of all 4 extremities. Good symmetric motor tone is noted throughout.  Sensory: Sensory testing is intact to soft touch on all 4 extremities. No evidence of extinction is noted.  Coordination: Cerebellar testing reveals good finger-nose-finger and heel-to-shin bilaterally.  Gait and station: Gait is normal. Tandem gait is normal. Romberg is negative. No  drift is seen.  Reflexes: Deep tendon reflexes are symmetric and normal bilaterally.   DIAGNOSTIC DATA (LABS, IMAGING, TESTING) - I reviewed patient records, labs, notes, testing and imaging myself where available.  Lab Results  Component Value Date   WBC 4.6 07/30/2018   HGB 12.8 (L) 07/30/2018   HCT 38.0 07/30/2018   MCV 93 07/30/2018   PLT 248 07/30/2018      Component Value Date/Time   NA 141 07/30/2018  1140   K 4.0 07/30/2018 1140   CL 103 07/30/2018 1140   CO2 23 07/30/2018 1140   GLUCOSE 88 07/30/2018 1140   GLUCOSE 84 05/28/2015 1617   BUN 21 07/30/2018 1140   CREATININE 1.00 07/30/2018 1140   CREATININE 0.86 05/28/2015 1617   CALCIUM 9.2 07/30/2018 1140   PROT 7.3 07/30/2018 1140   ALBUMIN 4.5 07/30/2018 1140   AST 22 07/30/2018 1140   ALT 21 07/30/2018 1140   ALKPHOS 90 07/30/2018 1140   BILITOT 0.2 07/30/2018 1140   GFRNONAA 81 07/30/2018 1140   GFRNONAA >89 05/28/2015 1617   GFRAA 93 07/30/2018 1140   GFRAA >89 05/28/2015 1617   Lab Results  Component Value Date   CHOL 253 (H) 11/06/2018   HDL 70 11/06/2018   LDLCALC 171 (H) 11/06/2018   TRIG 62 11/06/2018   CHOLHDL 3.6 11/06/2018   No results found for: HGBA1C No results found for: VITAMINB12 No results found for: TSH    ASSESSMENT AND PLAN 63 y.o. year old male  has a past medical history of Arthritis, High cholesterol, Hypertension, and Seizure ([place]). here with:  1.  Seizures  He has done quite well over the years.  He has not had a seizure since 2001.  I will check blood work today to include a carbamazepine level.  He will continue taking carbamazepine (Epitol) 200 mg tablet, 2 tablets at bedtime.  He will follow-up in 1 year or sooner if needed.  I advised that if symptoms worsen or if he develops any new symptoms he should let us know.  In the future, if he continues to do well his primary care doctor could continue to fill his medication, he can follow-up with [place] as needed.   I spent 15  minutes with the patient. 50% of this time was spent discussing his plan of care.   Butler Denmark, AGNP-C, DNP 08/01/2019, 10:23 AM [facility] [address], [place], [place] [address] [phone]

## 2019-08-01 ENCOUNTER — Other Ambulatory Visit: Payer: Self-pay

## 2019-08-01 ENCOUNTER — Encounter: Payer: Self-pay | Admitting: Neurology

## 2019-08-01 ENCOUNTER — Ambulatory Visit: Payer: BC Managed Care – PPO | Admitting: Neurology

## 2019-08-01 VITALS — BP 169/97 | HR 60 | Temp 97.5°F | Ht 72.0 in | Wt 187.6 lb

## 2019-08-01 DIAGNOSIS — G40309 Generalized idiopathic epilepsy and epileptic syndromes, not intractable, without status epilepticus: Secondary | ICD-10-CM | POA: Diagnosis not present

## 2019-08-01 MED ORDER — CARBAMAZEPINE 200 MG PO TABS: 400.0000 mg | ORAL_TABLET | Freq: Every day | ORAL | 3 refills | Status: DC

## 2019-08-01 NOTE — Patient Instructions (Signed)
It was a pleasure to meet you today! I will see you in 1 year!

## 2019-08-01 NOTE — Progress Notes (Signed)
I have read the note, and I agree with the clinical assessment and plan.  Shahad Mazurek K Patrice Matthew   

## 2019-08-02 LAB — COMPREHENSIVE METABOLIC PANEL
ALT: 20 IU/L (ref 0–44)
AST: 17 IU/L (ref 0–40)
Albumin/Globulin Ratio: 1.8 (ref 1.2–2.2)
Albumin: 4.7 g/dL (ref 3.8–4.8)
Alkaline Phosphatase: 85 IU/L (ref 39–117)
BUN/Creatinine Ratio: 18 (ref 10–24)
BUN: 15 mg/dL (ref 8–27)
Bilirubin Total: 0.3 mg/dL (ref 0.0–1.2)
CO2: 23 mmol/L (ref 20–29)
Calcium: 8.9 mg/dL (ref 8.6–10.2)
Chloride: 103 mmol/L (ref 96–106)
Creatinine, Ser: 0.83 mg/dL (ref 0.76–1.27)
GFR calc Af Amer: 109 mL/min/{1.73_m2} (ref >59)
GFR calc non Af Amer: 94 mL/min/{1.73_m2} (ref >59)
Globulin, Total: 2.6 g/dL (ref 1.5–4.5)
Glucose: 87 mg/dL (ref 65–99)
Potassium: 4.2 mmol/L (ref 3.5–5.2)
Sodium: 142 mmol/L (ref 134–144)
Total Protein: 7.3 g/dL (ref 6.0–8.5)

## 2019-08-02 LAB — CBC WITH DIFFERENTIAL/PLATELET
Basophils Absolute: 0 10*3/uL (ref 0.0–0.2)
Basos: 0 %
EOS (ABSOLUTE): 0.1 10*3/uL (ref 0.0–0.4)
Eos: 1 %
Hematocrit: 39.2 % (ref 37.5–51.0)
Hemoglobin: 13.5 g/dL (ref 13.0–17.7)
Immature Grans (Abs): 0 10*3/uL (ref 0.0–0.1)
Immature Granulocytes: 0 %
Lymphocytes Absolute: 1.5 10*3/uL (ref 0.7–3.1)
Lymphs: 27 %
MCH: 31.6 pg (ref 26.6–33.0)
MCHC: 34.4 g/dL (ref 31.5–35.7)
MCV: 92 fL (ref 79–97)
Monocytes Absolute: 0.6 10*3/uL (ref 0.1–0.9)
Monocytes: 10 %
Neutrophils Absolute: 3.4 10*3/uL (ref 1.4–7.0)
Neutrophils: 62 %
Platelets: 200 10*3/uL (ref 150–450)
RBC: 4.27 x10E6/uL (ref 4.14–5.80)
RDW: 13 % (ref 11.6–15.4)
WBC: 5.5 10*3/uL (ref 3.4–10.8)

## 2019-08-02 LAB — CARBAMAZEPINE LEVEL, TOTAL: Carbamazepine (Tegretol), S: 10.1 ug/mL (ref 4.0–12.0)

## 2019-08-05 ENCOUNTER — Encounter: Payer: Self-pay | Admitting: Neurology

## 2019-08-05 ENCOUNTER — Telehealth: Payer: Self-pay | Admitting: *Deleted

## 2019-08-05 NOTE — Telephone Encounter (Signed)
-----   Message from Suzzanne Cloud, NP sent at 08/04/2019 11:47 AM EDT ----- Please call the patient. Labs look good.

## 2019-08-05 NOTE — Telephone Encounter (Signed)
Sent mychart message

## 2019-08-19 ENCOUNTER — Encounter: Payer: Self-pay | Admitting: Emergency Medicine

## 2019-08-19 ENCOUNTER — Other Ambulatory Visit: Payer: Self-pay

## 2019-08-19 ENCOUNTER — Encounter: Payer: Self-pay | Admitting: Neurology

## 2019-08-19 ENCOUNTER — Ambulatory Visit (INDEPENDENT_AMBULATORY_CARE_PROVIDER_SITE_OTHER): Payer: BC Managed Care – PPO | Admitting: Emergency Medicine

## 2019-08-19 VITALS — BP 160/89 | HR 58 | Temp 98.3°F | Resp 16 | Wt 184.0 lb

## 2019-08-19 DIAGNOSIS — H9193 Unspecified hearing loss, bilateral: Secondary | ICD-10-CM | POA: Diagnosis not present

## 2019-08-19 DIAGNOSIS — E785 Hyperlipidemia, unspecified: Secondary | ICD-10-CM | POA: Diagnosis not present

## 2019-08-19 DIAGNOSIS — G40309 Generalized idiopathic epilepsy and epileptic syndromes, not intractable, without status epilepticus: Secondary | ICD-10-CM | POA: Diagnosis not present

## 2019-08-19 DIAGNOSIS — I10 Essential (primary) hypertension: Secondary | ICD-10-CM

## 2019-08-19 NOTE — Progress Notes (Signed)
BP Readings from Last 3 Encounters:  08/01/19 (!) 169/97  11/26/18 (!) 142/70  11/06/18 121/60   Lab Results  Component Value Date   CHOL 253 (H) 11/06/2018   HDL 70 11/06/2018   LDLCALC 171 (H) 11/06/2018   TRIG 62 11/06/2018   CHOLHDL 3.6 11/06/2018   Wt Readings from Last 3 Encounters:  08/19/19 184 lb (83.5 kg)  08/01/19 187 lb 9.6 oz (85.1 kg)  11/26/18 178 lb 3.2 oz (80.8 kg)   [name] 63 y.o.   Chief Complaint  Patient presents with  . Hypertension    follow up and cholesterol    HISTORY OF PRESENT ILLNESS: This is a 63 y.o. male with history of hypertension and dyslipidemia here for follow-up and medication refill. 1.  Hypertension: On amlodipine 5 mg daily.  Blood pressure at home has been 150s to 160s over 90s. 2.  Dyslipidemia: On Crestor 20 mg daily.  No side effects. 3.  Seizure disorder: On Tegretol 400 mg at bedtime.  Recently seen by neurologist and had blood work done on 08/01/2019.  CBC, CMP, and Tegretol level: All within normal limits and reviewed with patient. Has no complaints or medical concerns today.  HPI   Prior to Admission medications   Medication Sig Start Date End Date Taking? Authorizing Provider  amLODipine (NORVASC) 5 MG tablet Take 1 tablet (5 mg total) by mouth daily. 11/06/18  Yes Mayola Mcbain, [name], MD  carbamazepine (EPITOL) 200 MG tablet Take 2 tablets (400 mg total) by mouth at bedtime. 08/01/19  Yes Suzzanne Cloud, NP  co-enzyme Q-10 30 MG capsule Take 30 mg by mouth daily.   Yes [provider]  GARLIC PO Take by mouth daily.   Yes [provider]  GLUCOSAMINE-CHONDROITIN PO Take by mouth daily.   Yes [provider]  Omega-3 Fatty Acids (FISH OIL PO) Take by mouth daily.   Yes [provider]  Red Yeast Rice Extract (RED YEAST RICE PO) Take by mouth daily.   Yes [provider]  rosuvastatin (CRESTOR) 20 MG tablet Take 1 tablet (20 mg total) by mouth daily. 11/07/18  Yes  SagardiaInes Bloomer, MD    No Known Allergies  Patient Active Problem List   Diagnosis Date Noted  . Hyperlipidemia 08/19/2019  . Essential hypertension 09/05/2017  . Localization-related focal epilepsy with simple partial seizures ([place]) 08/05/2013  . Generalized convulsive epilepsy ([place]) 08/05/2013    Past Medical History:  Diagnosis Date  . Arthritis    hands   . High cholesterol   . Hypertension   . Seizure ([place])    last sz 2001    Past Surgical History:  Procedure Laterality Date  . COLONOSCOPY  10/15/2007  . None      Social History   Socioeconomic History  . Marital status: Married    Spouse name: [name]  . Number of children: Not on file  . Years of education: Not on file  . Highest education level: Not on file  Occupational History  . Occupation: Self Empolyed  Social Needs  . Financial resource strain: Not on file  . Food insecurity    Worry: Not on file    Inability: Not on file  . Transportation needs    Medical: Not on file    Non-medical: Not on file  Tobacco Use  . Smoking status: Never Smoker  . Smokeless tobacco: Never Used  Substance and Sexual Activity  . Alcohol use: No  . Drug use:  Yes    Types: "Crack" cocaine  . Sexual activity: Yes  Lifestyle  . Physical activity    Days per week: Not on file    Minutes per session: Not on file  . Stress: Not on file  Relationships  . Social Herbalist on phone: Not on file    Gets together: Not on file    Attends religious service: Not on file    Active member of club or organization: Not on file    Attends meetings of clubs or organizations: Not on file    Relationship status: Not on file  . Intimate partner violence    Fear of current or ex partner: Not on file    Emotionally abused: Not on file    Physically abused: Not on file    Forced sexual activity: Not on file  Other Topics Concern  . Not on file  Social History Narrative   Patient owns a [employer].   Patient is Married to Corinne.        Family History  Problem Relation Age of Onset  . Leukemia Mother   . Cancer Mother   . Heart attack Brother        2 in 6 months   . Hyperlipidemia Brother   . Cancer Sister        breast cancer  . Hyperlipidemia Sister   . Breast cancer Sister   . Cancer Father        bladder cancer  . Cancer Maternal Grandfather        lung cancer  . Cancer Paternal Grandfather        melanoma  . Hyperlipidemia Sister   . Colon cancer Neg Hx   . Colon polyps Neg Hx   . Esophageal cancer Neg Hx   . Rectal cancer Neg Hx   . Stomach cancer Neg Hx      Review of Systems  Constitutional: Negative.  Negative for chills, fever and malaise/fatigue.  HENT: Positive for hearing loss (Bilateral).   Eyes: Negative.  Negative for blurred vision and double vision.  Cardiovascular: Negative.  Negative for chest pain and palpitations.  Gastrointestinal: Negative for abdominal pain, nausea and vomiting.  Genitourinary: Negative.   Musculoskeletal: Negative.   Skin: Negative for rash.  Neurological: Negative.  Negative for dizziness and headaches.  Endo/Heme/Allergies: Negative.       Vitals:   08/19/19 0839  BP: (!) 160/89  Pulse: (!) 58  Resp: 16  Temp: 98.3 F (36.8 C)  SpO2: 98%    Physical Exam Vitals signs reviewed.  Constitutional:      Appearance: Normal appearance.  HENT:     Head: Normocephalic.     Right Ear: Tympanic membrane, ear canal and external ear normal.     Left Ear: Tympanic membrane, ear canal and external ear normal.  Eyes:     Extraocular Movements: Extraocular movements intact.     Conjunctiva/sclera: Conjunctivae normal.     Pupils: Pupils are equal, round, and reactive to light.  Neck:     Musculoskeletal: Normal range of motion and neck supple.  Cardiovascular:     Rate and Rhythm: Normal rate and regular rhythm.     Pulses: Normal pulses.     Heart sounds: Normal heart sounds.  Pulmonary:     Effort:  Pulmonary effort is normal.     Breath sounds: Normal breath sounds.  Abdominal:     Palpations: Abdomen is soft.  Tenderness: There is no abdominal tenderness.  Musculoskeletal: Normal range of motion.  Skin:    General: Skin is warm and dry.     Capillary Refill: Capillary refill takes less than 2 seconds.  Neurological:     General: No focal deficit present.     Mental Status: He is alert and oriented to person, place, and time.  Psychiatric:        Mood and Affect: Mood normal.        Behavior: Behavior normal.      ASSESSMENT & PLAN: Essential hypertension Blood pressure elevated today.  Repeat by me 158/90.  Recently at the dentist it was 169/90.  Blood pressures at home on the high side also.  Will increase amlodipine to 10 mg daily.  Advised to take blood pressure at home daily and keep a log.  Follow-up in 3 months.  Jerimyah was seen today for hypertension.  Diagnoses and all orders for this visit:  Essential hypertension  Hyperlipidemia, unspecified hyperlipidemia type -     Lipid panel  Generalized convulsive epilepsy ([place])  Bilateral hearing loss, unspecified hearing loss type -     Ambulatory referral to ENT    Patient Instructions       If you have lab work done today you will be contacted with your lab results within the next 2 weeks.  If you have not heard from [place] then please contact us. The fastest way to get your results is to register for My Chart.   IF you received an x-ray today, you will receive an invoice from [facility]. Please contact [facility] at [phone] with questions or concerns regarding your invoice.   IF you received labwork today, you will receive an invoice from [facility]. Please contact [facility] at [phone] with questions or concerns regarding your invoice.   Our billing staff will not be able to assist you with questions regarding bills from these companies.  You will be contacted with the lab  results as soon as they are available. The fastest way to get your results is to activate your My Chart account. Instructions are located on the last page of this paperwork. If you have not heard from [place] regarding the results in 2 weeks, please contact this office.     Hypertension, Adult High blood pressure (hypertension) is when the force of blood pumping through the arteries is too strong. The arteries are the blood vessels that carry blood from the heart throughout the body. Hypertension forces the heart to work harder to pump blood and may cause arteries to become narrow or stiff. Untreated or uncontrolled hypertension can cause a heart attack, heart failure, a stroke, kidney disease, and other problems. A blood pressure reading consists of a higher number over a lower number. Ideally, your blood pressure should be below 120/80. The first ("top") number is called the systolic pressure. It is a measure of the pressure in your arteries as your heart beats. The second ("bottom") number is called the diastolic pressure. It is a measure of the pressure in your arteries as the heart relaxes. What are the causes? The exact cause of this condition is not known. There are some conditions that result in or are related to high blood pressure. What increases the risk? Some risk factors for high blood pressure are under your control. The following factors may make you more likely to develop this condition:  Smoking.  Having type 2 diabetes mellitus, high cholesterol, or both.  Not getting  enough exercise or physical activity.  Being overweight.  Having too much fat, sugar, calories, or salt (sodium) in your diet.  Drinking too much alcohol. Some risk factors for high blood pressure may be difficult or impossible to change. Some of these factors include:  Having chronic kidney disease.  Having a family history of high blood pressure.  Age. Risk increases with age.  Race. You may be at higher  risk if you are African American.  Gender. Men are at higher risk than women before age 32. After age 54, women are at higher risk than men.  Having obstructive sleep apnea.  Stress. What are the signs or symptoms? High blood pressure may not cause symptoms. Very high blood pressure (hypertensive crisis) may cause:  Headache.  Anxiety.  Shortness of breath.  Nosebleed.  Nausea and vomiting.  Vision changes.  Severe chest pain.  Seizures. How is this diagnosed? This condition is diagnosed by measuring your blood pressure while you are seated, with your arm resting on a flat surface, your legs uncrossed, and your feet flat on the floor. The cuff of the blood pressure monitor will be placed directly against the skin of your upper arm at the level of your heart. It should be measured at least twice using the same arm. Certain conditions can cause a difference in blood pressure between your right and left arms. Certain factors can cause blood pressure readings to be lower or higher than normal for a short period of time:  When your blood pressure is higher when you are in a health care provider's office than when you are at home, this is called white coat hypertension. Most people with this condition do not need medicines.  When your blood pressure is higher at home than when you are in a health care provider's office, this is called masked hypertension. Most people with this condition may need medicines to control blood pressure. If you have a high blood pressure reading during one visit or you have normal blood pressure with other risk factors, you may be asked to:  Return on a different day to have your blood pressure checked again.  Monitor your blood pressure at home for 1 week or longer. If you are diagnosed with hypertension, you may have other blood or imaging tests to help your health care provider understand your overall risk for other conditions. How is this treated? This  condition is treated by making healthy lifestyle changes, such as eating healthy foods, exercising more, and reducing your alcohol intake. Your health care provider may prescribe medicine if lifestyle changes are not enough to get your blood pressure under control, and if:  Your systolic blood pressure is above 130.  Your diastolic blood pressure is above 80. Your personal target blood pressure may vary depending on your medical conditions, your age, and other factors. Follow these instructions at home: Eating and drinking   Eat a diet that is high in fiber and potassium, and low in sodium, added sugar, and fat. An example eating plan is called the DASH (Dietary Approaches to Stop Hypertension) diet. To eat this way: ? Eat plenty of fresh fruits and vegetables. Try to fill one half of your plate at each meal with fruits and vegetables. ? Eat whole grains, such as whole-wheat pasta, brown rice, or whole-grain bread. Fill about one fourth of your plate with whole grains. ? Eat or drink low-fat dairy products, such as skim milk or low-fat yogurt. ? Avoid fatty cuts of  meat, processed or cured meats, and poultry with skin. Fill about one fourth of your plate with lean proteins, such as fish, chicken without skin, beans, eggs, or tofu. ? Avoid pre-made and processed foods. These tend to be higher in sodium, added sugar, and fat.  Reduce your daily sodium intake. Most people with hypertension should eat less than 1,500 mg of sodium a day.  Do not drink alcohol if: ? Your health care provider tells you not to drink. ? You are pregnant, may be pregnant, or are planning to become pregnant.  If you drink alcohol: ? Limit how much you use to:  0-1 drink a day for women.  0-2 drinks a day for men. ? Be aware of how much alcohol is in your drink. In the [place] one drink equals one 12 oz bottle of beer (355 mL), one 5 oz glass of wine (148 mL), or one 1 oz glass of hard liquor (44 mL). Lifestyle    Work with your health care provider to maintain a healthy body weight or to lose weight. Ask what an ideal weight is for you.  Get at least 30 minutes of exercise most days of the week. Activities may include walking, swimming, or biking.  Include exercise to strengthen your muscles (resistance exercise), such as Pilates or lifting weights, as part of your weekly exercise routine. Try to do these types of exercises for 30 minutes at least 3 days a week.  Do not use any products that contain nicotine or tobacco, such as cigarettes, e-cigarettes, and chewing tobacco. If you need help quitting, ask your health care provider.  Monitor your blood pressure at home as told by your health care provider.  Keep all follow-up visits as told by your health care provider. This is important. Medicines  Take over-the-counter and prescription medicines only as told by your health care provider. Follow directions carefully. Blood pressure medicines must be taken as prescribed.  Do not skip doses of blood pressure medicine. Doing this puts you at risk for problems and can make the medicine less effective.  Ask your health care provider about side effects or reactions to medicines that you should watch for. Contact a health care provider if you:  Think you are having a reaction to a medicine you are taking.  Have headaches that keep coming back (recurring).  Feel dizzy.  Have swelling in your ankles.  Have trouble with your vision. Get help right away if you:  Develop a severe headache or confusion.  Have unusual weakness or numbness.  Feel faint.  Have severe pain in your chest or abdomen.  Vomit repeatedly.  Have trouble breathing. Summary  Hypertension is when the force of blood pumping through your arteries is too strong. If this condition is not controlled, it may put you at risk for serious complications.  Your personal target blood pressure may vary depending on your medical  conditions, your age, and other factors. For most people, a normal blood pressure is less than 120/80.  Hypertension is treated with lifestyle changes, medicines, or a combination of both. Lifestyle changes include losing weight, eating a healthy, low-sodium diet, exercising more, and limiting alcohol. This information is not intended to replace advice given to you by your health care provider. Make sure you discuss any questions you have with your health care provider. Document Released: 12/12/2005 Document Revised: 08/22/2018 Document Reviewed: 08/22/2018 Elsevier Patient Education  2020 [employer]      Agustina Caroli, MD [facility]

## 2019-08-19 NOTE — Patient Instructions (Addendum)
   If you have lab work done today you will be contacted with your lab results within the next 2 weeks.  If you have not heard from us then please contact us. The fastest way to get your results is to register for My Chart.   IF you received an x-ray today, you will receive an invoice from [facility]. Please contact [facility] at [phone] with questions or concerns regarding your invoice.   IF you received labwork today, you will receive an invoice from [facility]. Please contact [facility] at [phone] with questions or concerns regarding your invoice.   Our billing staff will not be able to assist you with questions regarding bills from these companies.  You will be contacted with the lab results as soon as they are available. The fastest way to get your results is to activate your My Chart account. Instructions are located on the last page of this paperwork. If you have not heard from us regarding the results in 2 weeks, please contact this office.     Hypertension, Adult High blood pressure (hypertension) is when the force of blood pumping through the arteries is too strong. The arteries are the blood vessels that carry blood from the heart throughout the body. Hypertension forces the heart to work harder to pump blood and may cause arteries to become narrow or stiff. Untreated or uncontrolled hypertension can cause a heart attack, heart failure, a stroke, kidney disease, and other problems. A blood pressure reading consists of a higher number over a lower number. Ideally, your blood pressure should be below 120/80. The first ("top") number is called the systolic pressure. It is a measure of the pressure in your arteries as your heart beats. The second ("bottom") number is called the diastolic pressure. It is a measure of the pressure in your arteries as the heart relaxes. What are the causes? The exact cause of this condition is not known. There are some conditions  that result in or are related to high blood pressure. What increases the risk? Some risk factors for high blood pressure are under your control. The following factors may make you more likely to develop this condition:  Smoking.  Having type 2 diabetes mellitus, high cholesterol, or both.  Not getting enough exercise or physical activity.  Being overweight.  Having too much fat, sugar, calories, or salt (sodium) in your diet.  Drinking too much alcohol. Some risk factors for high blood pressure may be difficult or impossible to change. Some of these factors include:  Having chronic kidney disease.  Having a family history of high blood pressure.  Age. Risk increases with age.  Race. You may be at higher risk if you are African American.  Gender. Men are at higher risk than women before age 45. After age 65, women are at higher risk than men.  Having obstructive sleep apnea.  Stress. What are the signs or symptoms? High blood pressure may not cause symptoms. Very high blood pressure (hypertensive crisis) may cause:  Headache.  Anxiety.  Shortness of breath.  Nosebleed.  Nausea and vomiting.  Vision changes.  Severe chest pain.  Seizures. How is this diagnosed? This condition is diagnosed by measuring your blood pressure while you are seated, with your arm resting on a flat surface, your legs uncrossed, and your feet flat on the floor. The cuff of the blood pressure monitor will be placed directly against the skin of your upper arm at the level of your heart.   It should be measured at least twice using the same arm. Certain conditions can cause a difference in blood pressure between your right and left arms. Certain factors can cause blood pressure readings to be lower or higher than normal for a short period of time:  When your blood pressure is higher when you are in a health care provider's office than when you are at home, this is called white coat hypertension.  Most people with this condition do not need medicines.  When your blood pressure is higher at home than when you are in a health care provider's office, this is called masked hypertension. Most people with this condition may need medicines to control blood pressure. If you have a high blood pressure reading during one visit or you have normal blood pressure with other risk factors, you may be asked to:  Return on a different day to have your blood pressure checked again.  Monitor your blood pressure at home for 1 week or longer. If you are diagnosed with hypertension, you may have other blood or imaging tests to help your health care provider understand your overall risk for other conditions. How is this treated? This condition is treated by making healthy lifestyle changes, such as eating healthy foods, exercising more, and reducing your alcohol intake. Your health care provider may prescribe medicine if lifestyle changes are not enough to get your blood pressure under control, and if:  Your systolic blood pressure is above 130.  Your diastolic blood pressure is above 80. Your personal target blood pressure may vary depending on your medical conditions, your age, and other factors. Follow these instructions at home: Eating and drinking   Eat a diet that is high in fiber and potassium, and low in sodium, added sugar, and fat. An example eating plan is called the DASH (Dietary Approaches to Stop Hypertension) diet. To eat this way: ? Eat plenty of fresh fruits and vegetables. Try to fill one half of your plate at each meal with fruits and vegetables. ? Eat whole grains, such as whole-wheat pasta, brown rice, or whole-grain bread. Fill about one fourth of your plate with whole grains. ? Eat or drink low-fat dairy products, such as skim milk or low-fat yogurt. ? Avoid fatty cuts of meat, processed or cured meats, and poultry with skin. Fill about one fourth of your plate with lean proteins, such  as fish, chicken without skin, beans, eggs, or tofu. ? Avoid pre-made and processed foods. These tend to be higher in sodium, added sugar, and fat.  Reduce your daily sodium intake. Most people with hypertension should eat less than 1,500 mg of sodium a day.  Do not drink alcohol if: ? Your health care provider tells you not to drink. ? You are pregnant, may be pregnant, or are planning to become pregnant.  If you drink alcohol: ? Limit how much you use to:  0-1 drink a day for women.  0-2 drinks a day for men. ? Be aware of how much alcohol is in your drink. In the [place] one drink equals one 12 oz bottle of beer (355 mL), one 5 oz glass of wine (148 mL), or one 1 oz glass of hard liquor (44 mL). Lifestyle   Work with your health care provider to maintain a healthy body weight or to lose weight. Ask what an ideal weight is for you.  Get at least 30 minutes of exercise most days of the week. Activities may include walking, swimming, or   biking.  Include exercise to strengthen your muscles (resistance exercise), such as Pilates or lifting weights, as part of your weekly exercise routine. Try to do these types of exercises for 30 minutes at least 3 days a week.  Do not use any products that contain nicotine or tobacco, such as cigarettes, e-cigarettes, and chewing tobacco. If you need help quitting, ask your health care provider.  Monitor your blood pressure at home as told by your health care provider.  Keep all follow-up visits as told by your health care provider. This is important. Medicines  Take over-the-counter and prescription medicines only as told by your health care provider. Follow directions carefully. Blood pressure medicines must be taken as prescribed.  Do not skip doses of blood pressure medicine. Doing this puts you at risk for problems and can make the medicine less effective.  Ask your health care provider about side effects or reactions to medicines that you  should watch for. Contact a health care provider if you:  Think you are having a reaction to a medicine you are taking.  Have headaches that keep coming back (recurring).  Feel dizzy.  Have swelling in your ankles.  Have trouble with your vision. Get help right away if you:  Develop a severe headache or confusion.  Have unusual weakness or numbness.  Feel faint.  Have severe pain in your chest or abdomen.  Vomit repeatedly.  Have trouble breathing. Summary  Hypertension is when the force of blood pumping through your arteries is too strong. If this condition is not controlled, it may put you at risk for serious complications.  Your personal target blood pressure may vary depending on your medical conditions, your age, and other factors. For most people, a normal blood pressure is less than 120/80.  Hypertension is treated with lifestyle changes, medicines, or a combination of both. Lifestyle changes include losing weight, eating a healthy, low-sodium diet, exercising more, and limiting alcohol. This information is not intended to replace advice given to you by your health care provider. Make sure you discuss any questions you have with your health care provider. Document Released: 12/12/2005 Document Revised: 08/22/2018 Document Reviewed: 08/22/2018 [employer] Patient Education  2020 [employer]  

## 2019-08-19 NOTE — Assessment & Plan Note (Signed)
Blood pressure elevated today.  Repeat by me 158/90.  Recently at the dentist it was 169/90.  Blood pressures at home on the high side also.  Will increase amlodipine to 10 mg daily.  Advised to take blood pressure at home daily and keep a log.  Follow-up in 3 months.

## 2019-08-20 ENCOUNTER — Encounter: Payer: Self-pay | Admitting: Emergency Medicine

## 2019-08-20 LAB — LIPID PANEL
Chol/HDL Ratio: 2.8 ratio (ref 0.0–5.0)
Cholesterol, Total: 196 mg/dL (ref 100–199)
HDL: 69 mg/dL (ref >39)
LDL Calculated: 116 mg/dL — ABNORMAL HIGH (ref 0–99)
Triglycerides: 54 mg/dL (ref 0–149)
VLDL Cholesterol Cal: 11 mg/dL (ref 5–40)

## 2019-10-31 ENCOUNTER — Other Ambulatory Visit: Payer: Self-pay | Admitting: Emergency Medicine

## 2019-10-31 MED ORDER — AMLODIPINE BESYLATE 10 MG PO TABS: 10.0000 mg | ORAL_TABLET | Freq: Every day | ORAL | 3 refills | Status: DC

## 2019-11-18 ENCOUNTER — Other Ambulatory Visit: Payer: Self-pay | Admitting: Emergency Medicine

## 2019-11-19 ENCOUNTER — Other Ambulatory Visit: Payer: Self-pay

## 2019-11-19 ENCOUNTER — Encounter: Payer: Self-pay | Admitting: Emergency Medicine

## 2019-11-19 ENCOUNTER — Ambulatory Visit: Payer: BC Managed Care – PPO | Admitting: Emergency Medicine

## 2019-11-19 VITALS — BP 127/80 | HR 58 | Temp 98.4°F | Resp 16 | Ht 72.0 in | Wt 186.8 lb

## 2019-11-19 DIAGNOSIS — E785 Hyperlipidemia, unspecified: Secondary | ICD-10-CM

## 2019-11-19 DIAGNOSIS — G40309 Generalized idiopathic epilepsy and epileptic syndromes, not intractable, without status epilepticus: Secondary | ICD-10-CM

## 2019-11-19 DIAGNOSIS — I10 Essential (primary) hypertension: Secondary | ICD-10-CM | POA: Diagnosis not present

## 2019-11-19 MED ORDER — ROSUVASTATIN CALCIUM 20 MG PO TABS: 20.0000 mg | ORAL_TABLET | Freq: Every day | ORAL | 3 refills | Status: DC

## 2019-11-19 NOTE — Assessment & Plan Note (Signed)
Continue Crestor 20 mg daily.  Repeat lipid profile while fasting in 6 months.

## 2019-11-19 NOTE — Assessment & Plan Note (Signed)
Well-controlled on amlodipine 10 mg daily.  Continue present medication and follow-up in 6 months.

## 2019-11-19 NOTE — Progress Notes (Signed)
BP Readings from Last 3 Encounters:  11/19/19 127/80  08/19/19 (!) 160/89  08/01/19 (!) 169/97   Shane Phillips 63 y.o.   Chief Complaint  Patient presents with  . Hypertension    FOLLOW UP 3 month  . Medication Refill    Crestor    HISTORY OF PRESENT ILLNESS: This is a 63 y.o. male with history of hypertension and dyslipidemia here for follow-up and medication refill. Presently taking amlodipine 10 mg daily with normal blood pressure readings at home and Crestor 20 mg daily. Also has a history of generalized convulsive disorder, on Tegretol, seeing neurologist on a regular basis. Has no complaints or medical concerns today.  Doing well.  HPI   Prior to Admission medications   Medication Sig Start Date End Date Taking? Authorizing Provider  amLODipine (NORVASC) 10 MG tablet Take 1 tablet (10 mg total) by mouth daily. 10/31/19  Yes Meoshia Billing, [name], MD  carbamazepine (EPITOL) 200 MG tablet Take 2 tablets (400 mg total) by mouth at bedtime. 08/01/19  Yes Suzzanne Cloud, NP  co-enzyme Q-10 30 MG capsule Take 30 mg by mouth daily.   Yes [provider]  GARLIC PO Take by mouth daily.   Yes [provider]  GLUCOSAMINE-CHONDROITIN PO Take by mouth daily.   Yes [provider]  Omega-3 Fatty Acids (FISH OIL PO) Take by mouth daily.   Yes [provider]  rosuvastatin (CRESTOR) 20 MG tablet TAKE ONE TABLET BY MOUTH DAILY 11/18/19  Yes Renner Sebald, [name], MD  Red Yeast Rice Extract (RED YEAST RICE PO) Take by mouth daily.    [provider]    No Known Allergies  Patient Active Problem List   Diagnosis Date Noted  . Hyperlipidemia 08/19/2019  . Essential hypertension 09/05/2017  . Localization-related focal epilepsy with simple partial seizures ([place]) 08/05/2013  . Generalized convulsive epilepsy ([place]) 08/05/2013    Past Medical History:  Diagnosis Date  . Arthritis    hands   . High cholesterol   . Hypertension   . Seizure  ([place])    last sz 2001    Past Surgical History:  Procedure Laterality Date  . COLONOSCOPY  10/15/2007  . None      Social History   Socioeconomic History  . Marital status: Married    Spouse name: [name]  . Number of children: Not on file  . Years of education: Not on file  . Highest education level: Not on file  Occupational History  . Occupation: Self Empolyed  Social Needs  . Financial resource strain: Not on file  . Food insecurity    Worry: Not on file    Inability: Not on file  . Transportation needs    Medical: Not on file    Non-medical: Not on file  Tobacco Use  . Smoking status: Never Smoker  . Smokeless tobacco: Never Used  Substance and Sexual Activity  . Alcohol use: No  . Drug use: Yes    Types: "Crack" cocaine  . Sexual activity: Yes  Lifestyle  . Physical activity    Days per week: Not on file    Minutes per session: Not on file  . Stress: Not on file  Relationships  . Social Herbalist on phone: Not on file    Gets together: Not on file    Attends religious service: Not on file    Active member of club or organization: Not on file    Attends meetings  of clubs or organizations: Not on file    Relationship status: Not on file  . Intimate partner violence    Fear of current or ex partner: Not on file    Emotionally abused: Not on file    Physically abused: Not on file    Forced sexual activity: Not on file  Other Topics Concern  . 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   Patient is Married to [place].        Family History  Problem Relation Age of Onset  . Leukemia Mother   . Cancer Mother   . Heart attack Brother        2 in 6 months   . Hyperlipidemia Brother   . Cancer Sister        breast cancer  . Hyperlipidemia Sister   . Breast cancer Sister   . Cancer Father        bladder cancer  . Cancer Maternal Grandfather        lung cancer  . Cancer Paternal Grandfather         melanoma  . Hyperlipidemia Sister   . Colon cancer Neg Hx   . Colon polyps Neg Hx   . Esophageal cancer Neg Hx   . Rectal cancer Neg Hx   . Stomach cancer Neg Hx      Review of Systems  Constitutional: Negative.  Negative for chills and fever.  HENT: Negative.  Negative for congestion and sore throat.   Eyes: Negative.   Respiratory: Negative.  Negative for cough and shortness of breath.   Cardiovascular: Negative.  Negative for chest pain and palpitations.  Gastrointestinal: Negative.  Negative for abdominal pain, blood in stool, diarrhea, melena, nausea and vomiting.  Genitourinary: Negative.  Negative for dysuria and hematuria.  Musculoskeletal: Negative.  Negative for joint pain and myalgias.  Skin: Negative.  Negative for rash.  Neurological: Negative.  Negative for dizziness and headaches.  All other systems reviewed and are negative.    Today's Vitals   11/19/19 0954  BP: 127/80  Pulse: (!) 58  Resp: 16  Temp: 98.4 F (36.9 C)  TempSrc: Oral  SpO2: 98%  Weight: 186 lb 12.8 oz (84.7 kg)  Height: 6' (1.829 m)   Body mass index is 25.33 kg/m.   Physical Exam Vitals signs reviewed.  Constitutional:      Appearance: Normal appearance.  HENT:     Head: Normocephalic.  Eyes:     Extraocular Movements: Extraocular movements intact.     Conjunctiva/sclera: Conjunctivae normal.     Pupils: Pupils are equal, round, and reactive to light.  Neck:     Musculoskeletal: Normal range of motion and neck supple.  Cardiovascular:     Rate and Rhythm: Normal rate and regular rhythm.     Pulses: Normal pulses.     Heart sounds: Normal heart sounds.  Pulmonary:     Effort: Pulmonary effort is normal.     Breath sounds: Normal breath sounds.  Musculoskeletal: Normal range of motion.  Skin:    General: Skin is warm and dry.     Capillary Refill: Capillary refill takes less than 2 seconds.  Neurological:     General: No focal deficit present.     Mental Status: He is  alert and oriented to person, place, and time.    A total of 25 minutes was spent in the room with the patient, greater than 50% of which  was in counseling/coordination of care regarding hypertension and dyslipidemia, cardiovascular risks associated with these, treatment, medication side effects, diet and nutrition, prognosis, need for follow-up.   ASSESSMENT & PLAN: Essential hypertension Well-controlled on amlodipine 10 mg daily.  Continue present medication and follow-up in 6 months.  Dyslipidemia Continue Crestor 20 mg daily.  Repeat lipid profile while fasting in 6 months.  Shane Phillips was seen today for hypertension and medication refill.  Diagnoses and all orders for this visit:  Essential hypertension  Dyslipidemia  Generalized convulsive epilepsy ([place])  Other orders -     rosuvastatin (CRESTOR) 20 MG tablet; Take 1 tablet (20 mg total) by mouth daily.    Patient Instructions       If you have lab work done today you will be contacted with your lab results within the next 2 weeks.  If you have not heard from [place] then please contact us. The fastest way to get your results is to register for My Chart.   IF you received an x-ray today, you will receive an invoice from [facility]. Please contact [facility] at [phone] with questions or concerns regarding your invoice.   IF you received labwork today, you will receive an invoice from [facility]. Please contact [facility] at [phone] with questions or concerns regarding your invoice.   Our billing staff will not be able to assist you with questions regarding bills from these companies.  You will be contacted with the lab results as soon as they are available. The fastest way to get your results is to activate your My Chart account. Instructions are located on the last page of this paperwork. If you have not heard from [place] regarding the results in 2 weeks, please contact this office.     Hypertension,  Adult High blood pressure (hypertension) is when the force of blood pumping through the arteries is too strong. The arteries are the blood vessels that carry blood from the heart throughout the body. Hypertension forces the heart to work harder to pump blood and may cause arteries to become narrow or stiff. Untreated or uncontrolled hypertension can cause a heart attack, heart failure, a stroke, kidney disease, and other problems. A blood pressure reading consists of a higher number over a lower number. Ideally, your blood pressure should be below 120/80. The first ("top") number is called the systolic pressure. It is a measure of the pressure in your arteries as your heart beats. The second ("bottom") number is called the diastolic pressure. It is a measure of the pressure in your arteries as the heart relaxes. What are the causes? The exact cause of this condition is not known. There are some conditions that result in or are related to high blood pressure. What increases the risk? Some risk factors for high blood pressure are under your control. The following factors may make you more likely to develop this condition:  Smoking.  Having type 2 diabetes mellitus, high cholesterol, or both.  Not getting enough exercise or physical activity.  Being overweight.  Having too much fat, sugar, calories, or salt (sodium) in your diet.  Drinking too much alcohol. Some risk factors for high blood pressure may be difficult or impossible to change. Some of these factors include:  Having chronic kidney disease.  Having a family history of high blood pressure.  Age. Risk increases with age.  Race. You may be at higher risk if you are African American.  Gender. Men are at higher risk than women before age  29. After age 9, women are at higher risk than men.  Having obstructive sleep apnea.  Stress. What are the signs or symptoms? High blood pressure may not cause symptoms. Very high blood  pressure (hypertensive crisis) may cause:  Headache.  Anxiety.  Shortness of breath.  Nosebleed.  Nausea and vomiting.  Vision changes.  Severe chest pain.  Seizures. How is this diagnosed? This condition is diagnosed by measuring your blood pressure while you are seated, with your arm resting on a flat surface, your legs uncrossed, and your feet flat on the floor. The cuff of the blood pressure monitor will be placed directly against the skin of your upper arm at the level of your heart. It should be measured at least twice using the same arm. Certain conditions can cause a difference in blood pressure between your right and left arms. Certain factors can cause blood pressure readings to be lower or higher than normal for a short period of time:  When your blood pressure is higher when you are in a health care provider's office than when you are at home, this is called white coat hypertension. Most people with this condition do not need medicines.  When your blood pressure is higher at home than when you are in a health care provider's office, this is called masked hypertension. Most people with this condition may need medicines to control blood pressure. If you have a high blood pressure reading during one visit or you have normal blood pressure with other risk factors, you may be asked to:  Return on a different day to have your blood pressure checked again.  Monitor your blood pressure at home for 1 week or longer. If you are diagnosed with hypertension, you may have other blood or imaging tests to help your health care provider understand your overall risk for other conditions. How is this treated? This condition is treated by making healthy lifestyle changes, such as eating healthy foods, exercising more, and reducing your alcohol intake. Your health care provider may prescribe medicine if lifestyle changes are not enough to get your blood pressure under control, and if:  Your  systolic blood pressure is above 130.  Your diastolic blood pressure is above 80. Your personal target blood pressure may vary depending on your medical conditions, your age, and other factors. Follow these instructions at home: Eating and drinking   Eat a diet that is high in fiber and potassium, and low in sodium, added sugar, and fat. An example eating plan is called the DASH (Dietary Approaches to Stop Hypertension) diet. To eat this way: ? Eat plenty of fresh fruits and vegetables. Try to fill one half of your plate at each meal with fruits and vegetables. ? Eat whole grains, such as whole-wheat pasta, brown rice, or whole-grain bread. Fill about one fourth of your plate with whole grains. ? Eat or drink low-fat dairy products, such as skim milk or low-fat yogurt. ? Avoid fatty cuts of meat, processed or cured meats, and poultry with skin. Fill about one fourth of your plate with lean proteins, such as fish, chicken without skin, beans, eggs, or tofu. ? Avoid pre-made and processed foods. These tend to be higher in sodium, added sugar, and fat.  Reduce your daily sodium intake. Most people with hypertension should eat less than 1,500 mg of sodium a day.  Do not drink alcohol if: ? Your health care provider tells you not to drink. ? You are pregnant, may be pregnant,  or are planning to become pregnant.  If you drink alcohol: ? Limit how much you use to:  0-1 drink a day for women.  0-2 drinks a day for men. ? Be aware of how much alcohol is in your drink. In the [place] one drink equals one 12 oz bottle of beer (355 mL), one 5 oz glass of wine (148 mL), or one 1 oz glass of hard liquor (44 mL). Lifestyle   Work with your health care provider to maintain a healthy body weight or to lose weight. Ask what an ideal weight is for you.  Get at least 30 minutes of exercise most days of the week. Activities may include walking, swimming, or biking.  Include exercise to strengthen your  muscles (resistance exercise), such as Pilates or lifting weights, as part of your weekly exercise routine. Try to do these types of exercises for 30 minutes at least 3 days a week.  Do not use any products that contain nicotine or tobacco, such as cigarettes, e-cigarettes, and chewing tobacco. If you need help quitting, ask your health care provider.  Monitor your blood pressure at home as told by your health care provider.  Keep all follow-up visits as told by your health care provider. This is important. Medicines  Take over-the-counter and prescription medicines only as told by your health care provider. Follow directions carefully. Blood pressure medicines must be taken as prescribed.  Do not skip doses of blood pressure medicine. Doing this puts you at risk for problems and can make the medicine less effective.  Ask your health care provider about side effects or reactions to medicines that you should watch for. Contact a health care provider if you:  Think you are having a reaction to a medicine you are taking.  Have headaches that keep coming back (recurring).  Feel dizzy.  Have swelling in your ankles.  Have trouble with your vision. Get help right away if you:  Develop a severe headache or confusion.  Have unusual weakness or numbness.  Feel faint.  Have severe pain in your chest or abdomen.  Vomit repeatedly.  Have trouble breathing. Summary  Hypertension is when the force of blood pumping through your arteries is too strong. If this condition is not controlled, it may put you at risk for serious complications.  Your personal target blood pressure may vary depending on your medical conditions, your age, and other factors. For most people, a normal blood pressure is less than 120/80.  Hypertension is treated with lifestyle changes, medicines, or a combination of both. Lifestyle changes include losing weight, eating a healthy, low-sodium diet, exercising more, and  limiting alcohol. This information is not intended to replace advice given to you by your health care provider. Make sure you discuss any questions you have with your health care provider. Document Released: 12/12/2005 Document Revised: 08/22/2018 Document Reviewed: 08/22/2018 Elsevier Patient Education  2020 [employer]      Agustina Caroli, MD [facility]

## 2019-11-19 NOTE — Patient Instructions (Addendum)
   If you have lab work done today you will be contacted with your lab results within the next 2 weeks.  If you have not heard from us then please contact us. The fastest way to get your results is to register for My Chart.   IF you received an x-ray today, you will receive an invoice from [facility]. Please contact [facility] at [phone] with questions or concerns regarding your invoice.   IF you received labwork today, you will receive an invoice from [facility]. Please contact [facility] at [phone] with questions or concerns regarding your invoice.   Our billing staff will not be able to assist you with questions regarding bills from these companies.  You will be contacted with the lab results as soon as they are available. The fastest way to get your results is to activate your My Chart account. Instructions are located on the last page of this paperwork. If you have not heard from us regarding the results in 2 weeks, please contact this office.     Hypertension, Adult High blood pressure (hypertension) is when the force of blood pumping through the arteries is too strong. The arteries are the blood vessels that carry blood from the heart throughout the body. Hypertension forces the heart to work harder to pump blood and may cause arteries to become narrow or stiff. Untreated or uncontrolled hypertension can cause a heart attack, heart failure, a stroke, kidney disease, and other problems. A blood pressure reading consists of a higher number over a lower number. Ideally, your blood pressure should be below 120/80. The first ("top") number is called the systolic pressure. It is a measure of the pressure in your arteries as your heart beats. The second ("bottom") number is called the diastolic pressure. It is a measure of the pressure in your arteries as the heart relaxes. What are the causes? The exact cause of this condition is not known. There are some conditions  that result in or are related to high blood pressure. What increases the risk? Some risk factors for high blood pressure are under your control. The following factors may make you more likely to develop this condition:  Smoking.  Having type 2 diabetes mellitus, high cholesterol, or both.  Not getting enough exercise or physical activity.  Being overweight.  Having too much fat, sugar, calories, or salt (sodium) in your diet.  Drinking too much alcohol. Some risk factors for high blood pressure may be difficult or impossible to change. Some of these factors include:  Having chronic kidney disease.  Having a family history of high blood pressure.  Age. Risk increases with age.  Race. You may be at higher risk if you are African American.  Gender. Men are at higher risk than women before age 45. After age 65, women are at higher risk than men.  Having obstructive sleep apnea.  Stress. What are the signs or symptoms? High blood pressure may not cause symptoms. Very high blood pressure (hypertensive crisis) may cause:  Headache.  Anxiety.  Shortness of breath.  Nosebleed.  Nausea and vomiting.  Vision changes.  Severe chest pain.  Seizures. How is this diagnosed? This condition is diagnosed by measuring your blood pressure while you are seated, with your arm resting on a flat surface, your legs uncrossed, and your feet flat on the floor. The cuff of the blood pressure monitor will be placed directly against the skin of your upper arm at the level of your heart.   It should be measured at least twice using the same arm. Certain conditions can cause a difference in blood pressure between your right and left arms. Certain factors can cause blood pressure readings to be lower or higher than normal for a short period of time:  When your blood pressure is higher when you are in a health care provider's office than when you are at home, this is called white coat hypertension.  Most people with this condition do not need medicines.  When your blood pressure is higher at home than when you are in a health care provider's office, this is called masked hypertension. Most people with this condition may need medicines to control blood pressure. If you have a high blood pressure reading during one visit or you have normal blood pressure with other risk factors, you may be asked to:  Return on a different day to have your blood pressure checked again.  Monitor your blood pressure at home for 1 week or longer. If you are diagnosed with hypertension, you may have other blood or imaging tests to help your health care provider understand your overall risk for other conditions. How is this treated? This condition is treated by making healthy lifestyle changes, such as eating healthy foods, exercising more, and reducing your alcohol intake. Your health care provider may prescribe medicine if lifestyle changes are not enough to get your blood pressure under control, and if:  Your systolic blood pressure is above 130.  Your diastolic blood pressure is above 80. Your personal target blood pressure may vary depending on your medical conditions, your age, and other factors. Follow these instructions at home: Eating and drinking   Eat a diet that is high in fiber and potassium, and low in sodium, added sugar, and fat. An example eating plan is called the DASH (Dietary Approaches to Stop Hypertension) diet. To eat this way: ? Eat plenty of fresh fruits and vegetables. Try to fill one half of your plate at each meal with fruits and vegetables. ? Eat whole grains, such as whole-wheat pasta, brown rice, or whole-grain bread. Fill about one fourth of your plate with whole grains. ? Eat or drink low-fat dairy products, such as skim milk or low-fat yogurt. ? Avoid fatty cuts of meat, processed or cured meats, and poultry with skin. Fill about one fourth of your plate with lean proteins, such  as fish, chicken without skin, beans, eggs, or tofu. ? Avoid pre-made and processed foods. These tend to be higher in sodium, added sugar, and fat.  Reduce your daily sodium intake. Most people with hypertension should eat less than 1,500 mg of sodium a day.  Do not drink alcohol if: ? Your health care provider tells you not to drink. ? You are pregnant, may be pregnant, or are planning to become pregnant.  If you drink alcohol: ? Limit how much you use to:  0-1 drink a day for women.  0-2 drinks a day for men. ? Be aware of how much alcohol is in your drink. In the [place] one drink equals one 12 oz bottle of beer (355 mL), one 5 oz glass of wine (148 mL), or one 1 oz glass of hard liquor (44 mL). Lifestyle   Work with your health care provider to maintain a healthy body weight or to lose weight. Ask what an ideal weight is for you.  Get at least 30 minutes of exercise most days of the week. Activities may include walking, swimming, or   biking.  Include exercise to strengthen your muscles (resistance exercise), such as Pilates or lifting weights, as part of your weekly exercise routine. Try to do these types of exercises for 30 minutes at least 3 days a week.  Do not use any products that contain nicotine or tobacco, such as cigarettes, e-cigarettes, and chewing tobacco. If you need help quitting, ask your health care provider.  Monitor your blood pressure at home as told by your health care provider.  Keep all follow-up visits as told by your health care provider. This is important. Medicines  Take over-the-counter and prescription medicines only as told by your health care provider. Follow directions carefully. Blood pressure medicines must be taken as prescribed.  Do not skip doses of blood pressure medicine. Doing this puts you at risk for problems and can make the medicine less effective.  Ask your health care provider about side effects or reactions to medicines that you  should watch for. Contact a health care provider if you:  Think you are having a reaction to a medicine you are taking.  Have headaches that keep coming back (recurring).  Feel dizzy.  Have swelling in your ankles.  Have trouble with your vision. Get help right away if you:  Develop a severe headache or confusion.  Have unusual weakness or numbness.  Feel faint.  Have severe pain in your chest or abdomen.  Vomit repeatedly.  Have trouble breathing. Summary  Hypertension is when the force of blood pumping through your arteries is too strong. If this condition is not controlled, it may put you at risk for serious complications.  Your personal target blood pressure may vary depending on your medical conditions, your age, and other factors. For most people, a normal blood pressure is less than 120/80.  Hypertension is treated with lifestyle changes, medicines, or a combination of both. Lifestyle changes include losing weight, eating a healthy, low-sodium diet, exercising more, and limiting alcohol. This information is not intended to replace advice given to you by your health care provider. Make sure you discuss any questions you have with your health care provider. Document Released: 12/12/2005 Document Revised: 08/22/2018 Document Reviewed: 08/22/2018 Elsevier Patient Education  2020 [employer]  

## 2019-12-08 ENCOUNTER — Encounter: Payer: Self-pay | Admitting: Emergency Medicine

## 2020-03-20 ENCOUNTER — Ambulatory Visit: Payer: Self-pay | Attending: Internal Medicine

## 2020-03-20 DIAGNOSIS — Z23 Encounter for immunization: Secondary | ICD-10-CM

## 2020-03-20 NOTE — Progress Notes (Signed)
   Covid-19 Vaccination Clinic  Name:  Zakariah Waltman    MRN: [id number] DOB: January 25, 1956  03/20/2020  Mr. Geeslin was observed post Covid-19 immunization for 15 minutes without incident. He was provided with Vaccine Information Sheet and instruction to access the V-Safe system.   Mr. Wurm was instructed to call 911 with any severe reactions post vaccine: Marland Kitchen Difficulty breathing  . Swelling of face and throat  . A fast heartbeat  . A bad rash all over body  . Dizziness and weakness   Immunizations Administered    Name Date Dose VIS Date Route   Pfizer COVID-19 Vaccine 03/20/2020  4:40 PM 0.3 mL 12/06/2019 Intramuscular   Manufacturer: [place]   Lot: [id number]   [place]: [id number]

## 2020-04-14 ENCOUNTER — Ambulatory Visit: Payer: Self-pay | Attending: Internal Medicine

## 2020-04-14 DIAGNOSIS — Z23 Encounter for immunization: Secondary | ICD-10-CM

## 2020-04-14 NOTE — Progress Notes (Signed)
   Covid-19 Vaccination Clinic  Name:  Jailan Jungwirth    MRN: [id number] DOB: 03/02/1956  04/14/2020  Mr. Fazzolari was observed post Covid-19 immunization for 15 minutes without incident. He was provided with Vaccine Information Sheet and instruction to access the V-Safe system.   Mr. Chauncey was instructed to call 911 with any severe reactions post vaccine: Marland Kitchen Difficulty breathing  . Swelling of face and throat  . A fast heartbeat  . A bad rash all over body  . Dizziness and weakness   Immunizations Administered    Name Date Dose VIS Date Route   Pfizer COVID-19 Vaccine 04/14/2020  4:31 PM 0.3 mL 02/19/2019 Intramuscular   Manufacturer: [place]   Lot: [id number]   [place]: [id number]

## 2020-05-19 ENCOUNTER — Ambulatory Visit: Payer: BC Managed Care – PPO | Admitting: Emergency Medicine

## 2020-05-28 ENCOUNTER — Ambulatory Visit: Payer: 59 | Admitting: Emergency Medicine

## 2020-05-28 ENCOUNTER — Other Ambulatory Visit: Payer: Self-pay

## 2020-05-28 ENCOUNTER — Encounter: Payer: Self-pay | Admitting: Emergency Medicine

## 2020-05-28 VITALS — BP 125/76 | HR 58 | Temp 98.1°F | Ht 72.0 in | Wt 183.0 lb

## 2020-05-28 DIAGNOSIS — Z20822 Contact with and (suspected) exposure to covid-19: Secondary | ICD-10-CM

## 2020-05-28 DIAGNOSIS — E785 Hyperlipidemia, unspecified: Secondary | ICD-10-CM

## 2020-05-28 DIAGNOSIS — I10 Essential (primary) hypertension: Secondary | ICD-10-CM

## 2020-05-28 DIAGNOSIS — G40309 Generalized idiopathic epilepsy and epileptic syndromes, not intractable, without status epilepticus: Secondary | ICD-10-CM

## 2020-05-28 NOTE — Progress Notes (Signed)
Shane Phillips 64 y.o.   Chief Complaint  Patient presents with  . Hypertension  . Hyperlipidemia  . skin concerns    red spot on R arm     HISTORY OF PRESENT ILLNESS: This is a 64 y.o. male with history of hypertension and dyslipidemia here for follow-up. 1.  Hypertension: On amlodipine 10 mg daily. BP Readings from Last 3 Encounters:  05/28/20 125/76  11/19/19 127/80  08/19/19 (!) 160/89   Lab Results  Component Value Date   CREATININE 0.83 08/01/2019   BUN 15 08/01/2019   NA 142 08/01/2019   K 4.2 08/01/2019   CL 103 08/01/2019   CO2 23 08/01/2019   2.  Dyslipidemia: On rosuvastatin 20 mg daily. Lab Results  Component Value Date   CHOL 196 08/19/2019   HDL 69 08/19/2019   LDLCALC 116 (H) 08/19/2019   TRIG 54 08/19/2019   CHOLHDL 2.8 08/19/2019   The 10-year ASCVD risk score Mikey Bussing DC Jr., et al., 2013) is: 9.8%   Values used to calculate the score:     Age: 31 years     Sex: Male     Is Non-Hispanic African American: No     Diabetic: No     Tobacco smoker: No     Systolic Blood Pressure: 875 mmHg     Is BP treated: Yes     HDL Cholesterol: 69 mg/dL     Total Cholesterol: 196 mg/dL Wt Readings from Last 3 Encounters:  05/28/20 183 lb (83 kg)  11/19/19 186 lb 12.8 oz (84.7 kg)  08/19/19 184 lb (83.5 kg)   Recently developed red rash to right forearm area after itching.  Picture looks like intradermal bleeding from minor trauma.  Much better today. No other complaints or medical concerns today. Overall doing very good. Fully vaccinated against Covid.  Inquiring about antibody.  HPI   Prior to Admission medications   Medication Sig Start Date End Date Taking? Authorizing Provider  amLODipine (NORVASC) 10 MG tablet Take 1 tablet (10 mg total) by mouth daily. 10/31/19  Yes Jemmie Rhinehart, [name], MD  carbamazepine (EPITOL) 200 MG tablet Take 2 tablets (400 mg total) by mouth at bedtime. 08/01/19  Yes Suzzanne Cloud, NP  co-enzyme Q-10 30 MG capsule Take 30 mg  by mouth daily.   Yes [provider]  GARLIC PO Take by mouth daily.   Yes [provider]  GLUCOSAMINE-CHONDROITIN PO Take by mouth daily.   Yes [provider]  Omega-3 Fatty Acids (FISH OIL PO) Take by mouth daily.   Yes [provider]  Red Yeast Rice Extract (RED YEAST RICE PO) Take by mouth daily.   Yes [provider]  rosuvastatin (CRESTOR) 20 MG tablet Take 1 tablet (20 mg total) by mouth daily. 11/19/19 02/17/20  Horald Pollen, MD    No Known Allergies  Patient Active Problem List   Diagnosis Date Noted  . Dyslipidemia 11/19/2019  . Hyperlipidemia 08/19/2019  . Essential hypertension 09/05/2017  . Localization-related focal epilepsy with simple partial seizures ([place]) 08/05/2013  . Generalized convulsive epilepsy ([place]) 08/05/2013    Past Medical History:  Diagnosis Date  . Arthritis    hands   . High cholesterol   . Hypertension   . Seizure ([place])    last sz 2001    Past Surgical History:  Procedure Laterality Date  . COLONOSCOPY  10/15/2007  . None      Social History   Socioeconomic History  . Marital  status: Married    Spouse name: [name]  . Number of children: Not on file  . Years of education: Not on file  . Highest education level: Not on file  Occupational History  . Occupation: Self Empolyed  Tobacco Use  . Smoking status: Never Smoker  . Smokeless tobacco: Never Used  Substance and Sexual Activity  . Alcohol use: No  . Drug use: Yes    Types: "Crack" cocaine  . Sexual activity: Yes  Other Topics Concern  . Not on file  Social History Narrative   Patient owns a [employer].   Patient is Married to [place].       Social Determinants of Health   Financial Resource Strain:   . Difficulty of Paying Living Expenses:   Food Insecurity:   . Worried About Charity fundraiser in the Last Year:   . Arboriculturist in the Last Year:   Transportation Needs:   . Lexicographer (Medical):   Marland Kitchen Lack of Transportation (Non-Medical):   Physical Activity:   . Days of Exercise per Week:   . Minutes of Exercise per Session:   Stress:   . Feeling of Stress :   Social Connections:   . Frequency of Communication with Friends and Family:   . Frequency of Social Gatherings with Friends and Family:   . Attends Religious Services:   . Active Member of Clubs or Organizations:   . Attends Archivist Meetings:   Marland Kitchen Marital Status:   Intimate Partner Violence:   . Fear of Current or Ex-Partner:   . Emotionally Abused:   Marland Kitchen Physically Abused:   . Sexually Abused:     Family History  Problem Relation Age of Onset  . Leukemia Mother   . Cancer Mother   . Heart attack Brother        2 in 6 months   . Hyperlipidemia Brother   . Cancer Sister        breast cancer  . Hyperlipidemia Sister   . Breast cancer Sister   . Cancer Father        bladder cancer  . Cancer Maternal Grandfather        lung cancer  . Cancer Paternal Grandfather        melanoma  . Hyperlipidemia Sister   . Colon cancer Neg Hx   . Colon polyps Neg Hx   . Esophageal cancer Neg Hx   . Rectal cancer Neg Hx   . Stomach cancer Neg Hx      Review of Systems  Constitutional: Negative.  Negative for chills and fever.  HENT: Negative.  Negative for congestion and sore throat.   Respiratory: Negative.  Negative for cough and shortness of breath.   Cardiovascular: Negative.  Negative for chest pain and palpitations.  Gastrointestinal: Negative.  Negative for abdominal pain, constipation, diarrhea, melena, nausea and vomiting.  Genitourinary: Negative.  Negative for dysuria and hematuria.  Musculoskeletal: Negative.   Skin: Positive for rash (Right forearm).  Neurological: Negative.  Negative for dizziness and headaches.  Endo/Heme/Allergies: Negative.   All other systems reviewed and are negative.  Today's Vitals   05/28/20 0947  BP: 125/76  Pulse: (!) 58  Temp: 98.1 F  (36.7 C)  SpO2: 98%  Weight: 183 lb (83 kg)  Height: 6' (1.829 m)   Body mass index is 24.82 kg/m.   Physical Exam Vitals reviewed.  Constitutional:      Appearance: Normal  appearance.  HENT:     Head: Normocephalic.     Mouth/Throat:     Mouth: Mucous membranes are moist.     Pharynx: Oropharynx is clear.  Eyes:     Extraocular Movements: Extraocular movements intact.     Conjunctiva/sclera: Conjunctivae normal.     Pupils: Pupils are equal, round, and reactive to light.  Cardiovascular:     Rate and Rhythm: Normal rate and regular rhythm.     Pulses: Normal pulses.     Heart sounds: Normal heart sounds.  Pulmonary:     Effort: Pulmonary effort is normal.     Breath sounds: Normal breath sounds.  Musculoskeletal:        General: Normal range of motion.     Cervical back: Normal range of motion and neck supple.  Skin:    General: Skin is warm and dry.     Capillary Refill: Capillary refill takes less than 2 seconds.  Neurological:     General: No focal deficit present.     Mental Status: He is alert and oriented to person, place, and time.  Psychiatric:        Mood and Affect: Mood normal.        Behavior: Behavior normal.     A total of 30 minutes was spent with the patient, greater than 50% of which was in counseling/coordination of care regarding hypertension and dyslipidemia, cardiovascular risks associated with these conditions, review of all medications and side effects, review of most recent blood work results, diet and nutrition, prognosis and need for follow-up in 6 months.  ASSESSMENT & PLAN: Clinically stable.  No medical concerns identified during this visit.  Continue present medications.  No changes.  Follow-up in 6 months.  Essential hypertension Well-controlled hypertension.  Continue amlodipine 10 mg daily.  Dyslipidemia Fasting labs done today.  Continue Crestor 20 mg daily.  [place] was seen today for hypertension, hyperlipidemia and skin  concerns.  Diagnoses and all orders for this visit:  Essential hypertension -     CMP14+EGFR -     CBC with Differential/Platelet  Dyslipidemia -     Lipid panel -     CBC with Differential/Platelet  Generalized convulsive epilepsy ([place])  Exposure to COVID-19 virus -     SAR CoV2 Serology (COVID 19)AB(IGG)IA    Patient Instructions       If you have lab work done today you will be contacted with your lab results within the next 2 weeks.  If you have not heard from [place] then please contact us. The fastest way to get your results is to register for My Chart.   IF you received an x-ray today, you will receive an invoice from [facility]. Please contact [facility] at [phone] with questions or concerns regarding your invoice.   IF you received labwork today, you will receive an invoice from [place]. Please contact [facility] at [phone] with questions or concerns regarding your invoice.   Our billing staff will not be able to assist you with questions regarding bills from these companies.  You will be contacted with the lab results as soon as they are available. The fastest way to get your results is to activate your My Chart account. Instructions are located on the last page of this paperwork. If you have not heard from [place] regarding the results in 2 weeks, please contact this office.      Health Maintenance, Male Adopting a healthy lifestyle and getting preventive care are important  in promoting health and wellness. Ask your health care provider about:  The right schedule for you to have regular tests and exams.  Things you can do on your own to prevent diseases and keep yourself healthy. What should I know about diet, weight, and exercise? Eat a healthy diet   Eat a diet that includes plenty of vegetables, fruits, low-fat dairy products, and lean protein.  Do not eat a lot of foods that are high in solid fats, added sugars, or sodium. Maintain  a healthy weight Body mass index (BMI) is a measurement that can be used to identify possible weight problems. It estimates body fat based on height and weight. Your health care provider can help determine your BMI and help you achieve or maintain a healthy weight. Get regular exercise Get regular exercise. This is one of the most important things you can do for your health. Most adults should:  Exercise for at least 150 minutes each week. The exercise should increase your heart rate and make you sweat (moderate-intensity exercise).  Do strengthening exercises at least twice a week. This is in addition to the moderate-intensity exercise.  Spend less time sitting. Even light physical activity can be beneficial. Watch cholesterol and blood lipids Have your blood tested for lipids and cholesterol at 63 years of age, then have this test every 5 years. You may need to have your cholesterol levels checked more often if:  Your lipid or cholesterol levels are high.  You are older than 63 years of age.  You are at high risk for heart disease. What should I know about cancer screening? Many types of cancers can be detected early and may often be prevented. Depending on your health history and family history, you may need to have cancer screening at various ages. This may include screening for:  Colorectal cancer.  Prostate cancer.  Skin cancer.  Lung cancer. What should I know about heart disease, diabetes, and high blood pressure? Blood pressure and heart disease  High blood pressure causes heart disease and increases the risk of stroke. This is more likely to develop in people who have high blood pressure readings, are of African descent, or are overweight.  Talk with your health care provider about your target blood pressure readings.  Have your blood pressure checked: ? Every 3-5 years if you are 33-61 years of age. ? Every year if you are 110 years old or older.  If you are between  the ages of 48 and 87 and are a current or former smoker, ask your health care provider if you should have a one-time screening for abdominal aortic aneurysm (AAA). Diabetes Have regular diabetes screenings. This checks your fasting blood sugar level. Have the screening done:  Once every three years after age 62 if you are at a normal weight and have a low risk for diabetes.  More often and at a younger age if you are overweight or have a high risk for diabetes. What should I know about preventing infection? Hepatitis B If you have a higher risk for hepatitis B, you should be screened for this virus. Talk with your health care provider to find out if you are at risk for hepatitis B infection. Hepatitis C Blood testing is recommended for:  Everyone born from [birth year] through 1964.  Anyone with known risk factors for hepatitis C. Sexually transmitted infections (STIs)  You should be screened each year for STIs, including gonorrhea and chlamydia, if: ? You are sexually active  and are younger than 63 years of age. ? You are older than 63 years of age and your health care provider tells you that you are at risk for this type of infection. ? Your sexual activity has changed since you were last screened, and you are at increased risk for chlamydia or gonorrhea. Ask your health care provider if you are at risk.  Ask your health care provider about whether you are at high risk for HIV. Your health care provider may recommend a prescription medicine to help prevent HIV infection. If you choose to take medicine to prevent HIV, you should first get tested for HIV. You should then be tested every 3 months for as long as you are taking the medicine. Follow these instructions at home: Lifestyle  Do not use any products that contain nicotine or tobacco, such as cigarettes, e-cigarettes, and chewing tobacco. If you need help quitting, ask your health care provider.  Do not use street drugs.  Do not share  needles.  Ask your health care provider for help if you need support or information about quitting drugs. Alcohol use  Do not drink alcohol if your health care provider tells you not to drink.  If you drink alcohol: ? Limit how much you have to 0-2 drinks a day. ? Be aware of how much alcohol is in your drink. In the [place] one drink equals one 12 oz bottle of beer (355 mL), one 5 oz glass of wine (148 mL), or one 1 oz glass of hard liquor (44 mL). General instructions  Schedule regular health, dental, and eye exams.  Stay current with your vaccines.  Tell your health care provider if: ? You often feel depressed. ? You have ever been abused or do not feel safe at home. Summary  Adopting a healthy lifestyle and getting preventive care are important in promoting health and wellness.  Follow your health care provider's instructions about healthy diet, exercising, and getting tested or screened for diseases.  Follow your health care provider's instructions on monitoring your cholesterol and blood pressure. This information is not intended to replace advice given to you by your health care provider. Make sure you discuss any questions you have with your health care provider. Document Revised: 12/05/2018 Document Reviewed: 12/05/2018 Elsevier Patient Education  [place].  Hypertension, Adult High blood pressure (hypertension) is when the force of blood pumping through the arteries is too strong. The arteries are the blood vessels that carry blood from the heart throughout the body. Hypertension forces the heart to work harder to pump blood and may cause arteries to become narrow or stiff. Untreated or uncontrolled hypertension can cause a heart attack, heart failure, a stroke, kidney disease, and other problems. A blood pressure reading consists of a higher number over a lower number. Ideally, your blood pressure should be below 120/80. The first ("top") number is called the  systolic pressure. It is a measure of the pressure in your arteries as your heart beats. The second ("bottom") number is called the diastolic pressure. It is a measure of the pressure in your arteries as the heart relaxes. What are the causes? The exact cause of this condition is not known. There are some conditions that result in or are related to high blood pressure. What increases the risk? Some risk factors for high blood pressure are under your control. The following factors may make you more likely to develop this condition:  Smoking.  Having type 2 diabetes mellitus,  high cholesterol, or both.  Not getting enough exercise or physical activity.  Being overweight.  Having too much fat, sugar, calories, or salt (sodium) in your diet.  Drinking too much alcohol. Some risk factors for high blood pressure may be difficult or impossible to change. Some of these factors include:  Having chronic kidney disease.  Having a family history of high blood pressure.  Age. Risk increases with age.  Race. You may be at higher risk if you are African American.  Gender. Men are at higher risk than women before age 56. After age 33, women are at higher risk than men.  Having obstructive sleep apnea.  Stress. What are the signs or symptoms? High blood pressure may not cause symptoms. Very high blood pressure (hypertensive crisis) may cause:  Headache.  Anxiety.  Shortness of breath.  Nosebleed.  Nausea and vomiting.  Vision changes.  Severe chest pain.  Seizures. How is this diagnosed? This condition is diagnosed by measuring your blood pressure while you are seated, with your arm resting on a flat surface, your legs uncrossed, and your feet flat on the floor. The cuff of the blood pressure monitor will be placed directly against the skin of your upper arm at the level of your heart. It should be measured at least twice using the same arm. Certain conditions can cause a difference  in blood pressure between your right and left arms. Certain factors can cause blood pressure readings to be lower or higher than normal for a short period of time:  When your blood pressure is higher when you are in a health care provider's office than when you are at home, this is called white coat hypertension. Most people with this condition do not need medicines.  When your blood pressure is higher at home than when you are in a health care provider's office, this is called masked hypertension. Most people with this condition may need medicines to control blood pressure. If you have a high blood pressure reading during one visit or you have normal blood pressure with other risk factors, you may be asked to:  Return on a different day to have your blood pressure checked again.  Monitor your blood pressure at home for 1 week or longer. If you are diagnosed with hypertension, you may have other blood or imaging tests to help your health care provider understand your overall risk for other conditions. How is this treated? This condition is treated by making healthy lifestyle changes, such as eating healthy foods, exercising more, and reducing your alcohol intake. Your health care provider may prescribe medicine if lifestyle changes are not enough to get your blood pressure under control, and if:  Your systolic blood pressure is above 130.  Your diastolic blood pressure is above 80. Your personal target blood pressure may vary depending on your medical conditions, your age, and other factors. Follow these instructions at home: Eating and drinking   Eat a diet that is high in fiber and potassium, and low in sodium, added sugar, and fat. An example eating plan is called the DASH (Dietary Approaches to Stop Hypertension) diet. To eat this way: ? Eat plenty of fresh fruits and vegetables. Try to fill one half of your plate at each meal with fruits and vegetables. ? Eat whole grains, such as  whole-wheat pasta, brown rice, or whole-grain bread. Fill about one fourth of your plate with whole grains. ? Eat or drink low-fat dairy products, such as skim milk or  low-fat yogurt. ? Avoid fatty cuts of meat, processed or cured meats, and poultry with skin. Fill about one fourth of your plate with lean proteins, such as fish, chicken without skin, beans, eggs, or tofu. ? Avoid pre-made and processed foods. These tend to be higher in sodium, added sugar, and fat.  Reduce your daily sodium intake. Most people with hypertension should eat less than 1,500 mg of sodium a day.  Do not drink alcohol if: ? Your health care provider tells you not to drink. ? You are pregnant, may be pregnant, or are planning to become pregnant.  If you drink alcohol: ? Limit how much you use to:  0-1 drink a day for women.  0-2 drinks a day for men. ? Be aware of how much alcohol is in your drink. In the [place] one drink equals one 12 oz bottle of beer (355 mL), one 5 oz glass of wine (148 mL), or one 1 oz glass of hard liquor (44 mL). Lifestyle   Work with your health care provider to maintain a healthy body weight or to lose weight. Ask what an ideal weight is for you.  Get at least 30 minutes of exercise most days of the week. Activities may include walking, swimming, or biking.  Include exercise to strengthen your muscles (resistance exercise), such as Pilates or lifting weights, as part of your weekly exercise routine. Try to do these types of exercises for 30 minutes at least 3 days a week.  Do not use any products that contain nicotine or tobacco, such as cigarettes, e-cigarettes, and chewing tobacco. If you need help quitting, ask your health care provider.  Monitor your blood pressure at home as told by your health care provider.  Keep all follow-up visits as told by your health care provider. This is important. Medicines  Take over-the-counter and prescription medicines only as told by your  health care provider. Follow directions carefully. Blood pressure medicines must be taken as prescribed.  Do not skip doses of blood pressure medicine. Doing this puts you at risk for problems and can make the medicine less effective.  Ask your health care provider about side effects or reactions to medicines that you should watch for. Contact a health care provider if you:  Think you are having a reaction to a medicine you are taking.  Have headaches that keep coming back (recurring).  Feel dizzy.  Have swelling in your ankles.  Have trouble with your vision. Get help right away if you:  Develop a severe headache or confusion.  Have unusual weakness or numbness.  Feel faint.  Have severe pain in your chest or abdomen.  Vomit repeatedly.  Have trouble breathing. Summary  Hypertension is when the force of blood pumping through your arteries is too strong. If this condition is not controlled, it may put you at risk for serious complications.  Your personal target blood pressure may vary depending on your medical conditions, your age, and other factors. For most people, a normal blood pressure is less than 120/80.  Hypertension is treated with lifestyle changes, medicines, or a combination of both. Lifestyle changes include losing weight, eating a healthy, low-sodium diet, exercising more, and limiting alcohol. This information is not intended to replace advice given to you by your health care provider. Make sure you discuss any questions you have with your health care provider. Document Revised: 08/22/2018 Document Reviewed: 08/22/2018 Elsevier Patient Education  2020 Elsevier Inc.     Agustina Caroli, MD  [facility]

## 2020-05-28 NOTE — Assessment & Plan Note (Signed)
Well-controlled hypertension.  Continue amlodipine 10 mg daily.

## 2020-05-28 NOTE — Assessment & Plan Note (Signed)
Fasting labs done today.  Continue Crestor 20 mg daily.

## 2020-05-28 NOTE — Patient Instructions (Addendum)
   If you have lab work done today you will be contacted with your lab results within the next 2 weeks.  If you have not heard from us then please contact us. The fastest way to get your results is to register for My Chart.   IF you received an x-ray today, you will receive an invoice from [facility]. Please contact [facility] at [phone] with questions or concerns regarding your invoice.   IF you received labwork today, you will receive an invoice from [facility]. Please contact [facility] at [phone] with questions or concerns regarding your invoice.   Our billing staff will not be able to assist you with questions regarding bills from these companies.  You will be contacted with the lab results as soon as they are available. The fastest way to get your results is to activate your My Chart account. Instructions are located on the last page of this paperwork. If you have not heard from us regarding the results in 2 weeks, please contact this office.      Health Maintenance, Male Adopting a healthy lifestyle and getting preventive care are important in promoting health and wellness. Ask your health care provider about:  The right schedule for you to have regular tests and exams.  Things you can do on your own to prevent diseases and keep yourself healthy. What should I know about diet, weight, and exercise? Eat a healthy diet   Eat a diet that includes plenty of vegetables, fruits, low-fat dairy products, and lean protein.  Do not eat a lot of foods that are high in solid fats, added sugars, or sodium. Maintain a healthy weight Body mass index (BMI) is a measurement that can be used to identify possible weight problems. It estimates body fat based on height and weight. Your health care provider can help determine your BMI and help you achieve or maintain a healthy weight. Get regular exercise Get regular exercise. This is one of the most important things you  can do for your health. Most adults should:  Exercise for at least 150 minutes each week. The exercise should increase your heart rate and make you sweat (moderate-intensity exercise).  Do strengthening exercises at least twice a week. This is in addition to the moderate-intensity exercise.  Spend less time sitting. Even light physical activity can be beneficial. Watch cholesterol and blood lipids Have your blood tested for lipids and cholesterol at 63 years of age, then have this test every 5 years. You may need to have your cholesterol levels checked more often if:  Your lipid or cholesterol levels are high.  You are older than 63 years of age.  You are at high risk for heart disease. What should I know about cancer screening? Many types of cancers can be detected early and may often be prevented. Depending on your health history and family history, you may need to have cancer screening at various ages. This may include screening for:  Colorectal cancer.  Prostate cancer.  Skin cancer.  Lung cancer. What should I know about heart disease, diabetes, and high blood pressure? Blood pressure and heart disease  High blood pressure causes heart disease and increases the risk of stroke. This is more likely to develop in people who have high blood pressure readings, are of African descent, or are overweight.  Talk with your health care provider about your target blood pressure readings.  Have your blood pressure checked: ? Every 3-5 years if you are 18-39   of age. ? Every year if you are 40 years old or older.  If you are between the ages of 65 and 75 and are a current or former smoker, ask your health care provider if you should have a one-time screening for abdominal aortic aneurysm (AAA). Diabetes Have regular diabetes screenings. This checks your fasting blood sugar level. Have the screening done:  Once every three years after age 45 if you are at a normal weight and have  a low risk for diabetes.  More often and at a younger age if you are overweight or have a high risk for diabetes. What should I know about preventing infection? Hepatitis B If you have a higher risk for hepatitis B, you should be screened for this virus. Talk with your health care provider to find out if you are at risk for hepatitis B infection. Hepatitis C Blood testing is recommended for:  Everyone born from 1945 through 1965.  Anyone with known risk factors for hepatitis C. Sexually transmitted infections (STIs)  You should be screened each year for STIs, including gonorrhea and chlamydia, if: ? You are sexually active and are younger than 63 years of age. ? You are older than 63 years of age and your health care provider tells you that you are at risk for this type of infection. ? Your sexual activity has changed since you were last screened, and you are at increased risk for chlamydia or gonorrhea. Ask your health care provider if you are at risk.  Ask your health care provider about whether you are at high risk for HIV. Your health care provider may recommend a prescription medicine to help prevent HIV infection. If you choose to take medicine to prevent HIV, you should first get tested for HIV. You should then be tested every 3 months for as long as you are taking the medicine. Follow these instructions at home: Lifestyle  Do not use any products that contain nicotine or tobacco, such as cigarettes, e-cigarettes, and chewing tobacco. If you need help quitting, ask your health care provider.  Do not use street drugs.  Do not share needles.  Ask your health care provider for help if you need support or information about quitting drugs. Alcohol use  Do not drink alcohol if your health care provider tells you not to drink.  If you drink alcohol: ? Limit how much you have to 0-2 drinks a day. ? Be aware of how much alcohol is in your drink. In the [place] one drink equals one 12  oz bottle of beer (355 mL), one 5 oz glass of wine (148 mL), or one 1 oz glass of hard liquor (44 mL). General instructions  Schedule regular health, dental, and eye exams.  Stay current with your vaccines.  Tell your health care provider if: ? You often feel depressed. ? You have ever been abused or do not feel safe at home. Summary  Adopting a healthy lifestyle and getting preventive care are important in promoting health and wellness.  Follow your health care provider's instructions about healthy diet, exercising, and getting tested or screened for diseases.  Follow your health care provider's instructions on monitoring your cholesterol and blood pressure. This information is not intended to replace advice given to you by your health care provider. Make sure you discuss any questions you have with your health care provider. Document Revised: 12/05/2018 Document Reviewed: 12/05/2018 Elsevier Patient Education  2020 [employer]  Hypertension, Adult High blood pressure (hypertension)   is when the force of blood pumping through the arteries is too strong. The arteries are the blood vessels that carry blood from the heart throughout the body. Hypertension forces the heart to work harder to pump blood and may cause arteries to become narrow or stiff. Untreated or uncontrolled hypertension can cause a heart attack, heart failure, a stroke, kidney disease, and other problems. A blood pressure reading consists of a higher number over a lower number. Ideally, your blood pressure should be below 120/80. The first ("top") number is called the systolic pressure. It is a measure of the pressure in your arteries as your heart beats. The second ("bottom") number is called the diastolic pressure. It is a measure of the pressure in your arteries as the heart relaxes. What are the causes? The exact cause of this condition is not known. There are some conditions that result in or are related to high blood  pressure. What increases the risk? Some risk factors for high blood pressure are under your control. The following factors may make you more likely to develop this condition:  Smoking.  Having type 2 diabetes mellitus, high cholesterol, or both.  Not getting enough exercise or physical activity.  Being overweight.  Having too much fat, sugar, calories, or salt (sodium) in your diet.  Drinking too much alcohol. Some risk factors for high blood pressure may be difficult or impossible to change. Some of these factors include:  Having chronic kidney disease.  Having a family history of high blood pressure.  Age. Risk increases with age.  Race. You may be at higher risk if you are African American.  Gender. Men are at higher risk than women before age 45. After age 65, women are at higher risk than men.  Having obstructive sleep apnea.  Stress. What are the signs or symptoms? High blood pressure may not cause symptoms. Very high blood pressure (hypertensive crisis) may cause:  Headache.  Anxiety.  Shortness of breath.  Nosebleed.  Nausea and vomiting.  Vision changes.  Severe chest pain.  Seizures. How is this diagnosed? This condition is diagnosed by measuring your blood pressure while you are seated, with your arm resting on a flat surface, your legs uncrossed, and your feet flat on the floor. The cuff of the blood pressure monitor will be placed directly against the skin of your upper arm at the level of your heart. It should be measured at least twice using the same arm. Certain conditions can cause a difference in blood pressure between your right and left arms. Certain factors can cause blood pressure readings to be lower or higher than normal for a short period of time:  When your blood pressure is higher when you are in a health care provider's office than when you are at home, this is called white coat hypertension. Most people with this condition do not need  medicines.  When your blood pressure is higher at home than when you are in a health care provider's office, this is called masked hypertension. Most people with this condition may need medicines to control blood pressure. If you have a high blood pressure reading during one visit or you have normal blood pressure with other risk factors, you may be asked to:  Return on a different day to have your blood pressure checked again.  Monitor your blood pressure at home for 1 week or longer. If you are diagnosed with hypertension, you may have other blood or imaging tests to help your   health care provider understand your overall risk for other conditions. How is this treated? This condition is treated by making healthy lifestyle changes, such as eating healthy foods, exercising more, and reducing your alcohol intake. Your health care provider may prescribe medicine if lifestyle changes are not enough to get your blood pressure under control, and if:  Your systolic blood pressure is above 130.  Your diastolic blood pressure is above 80. Your personal target blood pressure may vary depending on your medical conditions, your age, and other factors. Follow these instructions at home: Eating and drinking   Eat a diet that is high in fiber and potassium, and low in sodium, added sugar, and fat. An example eating plan is called the DASH (Dietary Approaches to Stop Hypertension) diet. To eat this way: ? Eat plenty of fresh fruits and vegetables. Try to fill one half of your plate at each meal with fruits and vegetables. ? Eat whole grains, such as whole-wheat pasta, brown rice, or whole-grain bread. Fill about one fourth of your plate with whole grains. ? Eat or drink low-fat dairy products, such as skim milk or low-fat yogurt. ? Avoid fatty cuts of meat, processed or cured meats, and poultry with skin. Fill about one fourth of your plate with lean proteins, such as fish, chicken without skin, beans, eggs,  or tofu. ? Avoid pre-made and processed foods. These tend to be higher in sodium, added sugar, and fat.  Reduce your daily sodium intake. Most people with hypertension should eat less than 1,500 mg of sodium a day.  Do not drink alcohol if: ? Your health care provider tells you not to drink. ? You are pregnant, may be pregnant, or are planning to become pregnant.  If you drink alcohol: ? Limit how much you use to:  0-1 drink a day for women.  0-2 drinks a day for men. ? Be aware of how much alcohol is in your drink. In the [place] one drink equals one 12 oz bottle of beer (355 mL), one 5 oz glass of wine (148 mL), or one 1 oz glass of hard liquor (44 mL). Lifestyle   Work with your health care provider to maintain a healthy body weight or to lose weight. Ask what an ideal weight is for you.  Get at least 30 minutes of exercise most days of the week. Activities may include walking, swimming, or biking.  Include exercise to strengthen your muscles (resistance exercise), such as Pilates or lifting weights, as part of your weekly exercise routine. Try to do these types of exercises for 30 minutes at least 3 days a week.  Do not use any products that contain nicotine or tobacco, such as cigarettes, e-cigarettes, and chewing tobacco. If you need help quitting, ask your health care provider.  Monitor your blood pressure at home as told by your health care provider.  Keep all follow-up visits as told by your health care provider. This is important. Medicines  Take over-the-counter and prescription medicines only as told by your health care provider. Follow directions carefully. Blood pressure medicines must be taken as prescribed.  Do not skip doses of blood pressure medicine. Doing this puts you at risk for problems and can make the medicine less effective.  Ask your health care provider about side effects or reactions to medicines that you should watch for. Contact a health care  provider if you:  Think you are having a reaction to a medicine you are taking.  Have headaches   that keep coming back (recurring).  Feel dizzy.  Have swelling in your ankles.  Have trouble with your vision. Get help right away if you:  Develop a severe headache or confusion.  Have unusual weakness or numbness.  Feel faint.  Have severe pain in your chest or abdomen.  Vomit repeatedly.  Have trouble breathing. Summary  Hypertension is when the force of blood pumping through your arteries is too strong. If this condition is not controlled, it may put you at risk for serious complications.  Your personal target blood pressure may vary depending on your medical conditions, your age, and other factors. For most people, a normal blood pressure is less than 120/80.  Hypertension is treated with lifestyle changes, medicines, or a combination of both. Lifestyle changes include losing weight, eating a healthy, low-sodium diet, exercising more, and limiting alcohol. This information is not intended to replace advice given to you by your health care provider. Make sure you discuss any questions you have with your health care provider. Document Revised: 08/22/2018 Document Reviewed: 08/22/2018 Elsevier Patient Education  2020 [employer] Inc.  

## 2020-05-29 ENCOUNTER — Encounter: Payer: Self-pay | Admitting: Emergency Medicine

## 2020-05-29 LAB — CBC WITH DIFFERENTIAL/PLATELET
Basophils Absolute: 0 10*3/uL (ref 0.0–0.2)
Basos: 0 %
EOS (ABSOLUTE): 0.1 10*3/uL (ref 0.0–0.4)
Eos: 1 %
Hematocrit: 38.8 % (ref 37.5–51.0)
Hemoglobin: 13.6 g/dL (ref 13.0–17.7)
Immature Grans (Abs): 0 10*3/uL (ref 0.0–0.1)
Immature Granulocytes: 0 %
Lymphocytes Absolute: 1.5 10*3/uL (ref 0.7–3.1)
Lymphs: 27 %
MCH: 31.2 pg (ref 26.6–33.0)
MCHC: 35.1 g/dL (ref 31.5–35.7)
MCV: 89 fL (ref 79–97)
Monocytes Absolute: 0.7 10*3/uL (ref 0.1–0.9)
Monocytes: 12 %
Neutrophils Absolute: 3.4 10*3/uL (ref 1.4–7.0)
Neutrophils: 60 %
Platelets: 211 10*3/uL (ref 150–450)
RBC: 4.36 x10E6/uL (ref 4.14–5.80)
RDW: 12.8 % (ref 11.6–15.4)
WBC: 5.7 10*3/uL (ref 3.4–10.8)

## 2020-05-29 LAB — LIPID PANEL
Chol/HDL Ratio: 3.1 ratio (ref 0.0–5.0)
Cholesterol, Total: 200 mg/dL — ABNORMAL HIGH (ref 100–199)
HDL: 65 mg/dL (ref >39)
LDL Chol Calc (NIH): 123 mg/dL — ABNORMAL HIGH (ref 0–99)
Triglycerides: 64 mg/dL (ref 0–149)
VLDL Cholesterol Cal: 12 mg/dL (ref 5–40)

## 2020-05-29 LAB — CMP14+EGFR
ALT: 26 IU/L (ref 0–44)
AST: 19 IU/L (ref 0–40)
Albumin/Globulin Ratio: 1.9 (ref 1.2–2.2)
Albumin: 4.6 g/dL (ref 3.8–4.8)
Alkaline Phosphatase: 98 IU/L (ref 48–121)
BUN/Creatinine Ratio: 17 (ref 10–24)
BUN: 17 mg/dL (ref 8–27)
Bilirubin Total: 0.3 mg/dL (ref 0.0–1.2)
CO2: 26 mmol/L (ref 20–29)
Calcium: 9 mg/dL (ref 8.6–10.2)
Chloride: 101 mmol/L (ref 96–106)
Creatinine, Ser: 1 mg/dL (ref 0.76–1.27)
GFR calc Af Amer: 92 mL/min/{1.73_m2} (ref >59)
GFR calc non Af Amer: 80 mL/min/{1.73_m2} (ref >59)
Globulin, Total: 2.4 g/dL (ref 1.5–4.5)
Glucose: 92 mg/dL (ref 65–99)
Potassium: 4 mmol/L (ref 3.5–5.2)
Sodium: 140 mmol/L (ref 134–144)
Total Protein: 7 g/dL (ref 6.0–8.5)

## 2020-05-29 LAB — SAR COV2 SEROLOGY (COVID19)AB(IGG),IA: DiaSorin SARS-CoV-2 Ab, IgG: POSITIVE

## 2020-08-03 ENCOUNTER — Ambulatory Visit: Payer: BC Managed Care – PPO | Admitting: Neurology

## 2020-10-23 ENCOUNTER — Other Ambulatory Visit: Payer: Self-pay | Admitting: Emergency Medicine

## 2020-10-29 ENCOUNTER — Other Ambulatory Visit: Payer: Self-pay | Admitting: Neurology

## 2020-11-02 ENCOUNTER — Telehealth: Payer: Self-pay | Admitting: Emergency Medicine

## 2020-11-02 NOTE — Telephone Encounter (Signed)
Please see note- routing to [place]

## 2020-11-02 NOTE — Telephone Encounter (Signed)
Medication: carbamazepine (EPITOL) 200 MG tablet [[id number]]  original prescriber- Suzzanne Cloud, NP - Patient was advised by original prescriber that PCP can prescribed medication.   Has the patient contacted their pharmacy? YES  (Agent: If no, request that the patient contact the pharmacy for the refill.) (Agent: If yes, when and what did the pharmacy advise?)  Preferred Pharmacy (with phone number or street name): [facility] [address] [place] [facility] [address]  [address], [place] [address]  Phone:  [phone] Fax:  [phone]   Agent: Please be advised that RX refills may take up to 3 business days. We ask that you follow-up with your pharmacy.

## 2020-11-03 ENCOUNTER — Other Ambulatory Visit: Payer: Self-pay

## 2020-11-03 MED ORDER — CARBAMAZEPINE 200 MG PO TABS: 400.0000 mg | ORAL_TABLET | Freq: Every day | ORAL | 3 refills | Status: DC

## 2020-11-30 ENCOUNTER — Ambulatory Visit: Payer: 59 | Admitting: Emergency Medicine

## 2020-12-01 ENCOUNTER — Other Ambulatory Visit: Payer: Self-pay

## 2020-12-01 ENCOUNTER — Encounter: Payer: Self-pay | Admitting: Emergency Medicine

## 2020-12-01 ENCOUNTER — Ambulatory Visit: Payer: 59 | Admitting: Emergency Medicine

## 2020-12-01 ENCOUNTER — Telehealth: Payer: Self-pay | Admitting: Emergency Medicine

## 2020-12-01 VITALS — BP 130/80 | HR 60 | Temp 98.0°F | Resp 16 | Ht 71.0 in | Wt 190.0 lb

## 2020-12-01 DIAGNOSIS — G40309 Generalized idiopathic epilepsy and epileptic syndromes, not intractable, without status epilepticus: Secondary | ICD-10-CM | POA: Diagnosis not present

## 2020-12-01 DIAGNOSIS — I10 Essential (primary) hypertension: Secondary | ICD-10-CM

## 2020-12-01 DIAGNOSIS — E785 Hyperlipidemia, unspecified: Secondary | ICD-10-CM

## 2020-12-01 NOTE — Patient Instructions (Addendum)
   If you have lab work done today you will be contacted with your lab results within the next 2 weeks.  If you have not heard from us then please contact us. The fastest way to get your results is to register for My Chart.   IF you received an x-ray today, you will receive an invoice from [facility]. Please contact  Radiology at [phone] with questions or concerns regarding your invoice.   IF you received labwork today, you will receive an invoice from [facility]. Please contact [facility] at [phone] with questions or concerns regarding your invoice.   Our billing staff will not be able to assist you with questions regarding bills from these companies.  You will be contacted with the lab results as soon as they are available. The fastest way to get your results is to activate your My Chart account. Instructions are located on the last page of this paperwork. If you have not heard from us regarding the results in 2 weeks, please contact this office.      Hypertension, Adult High blood pressure (hypertension) is when the force of blood pumping through the arteries is too strong. The arteries are the blood vessels that carry blood from the heart throughout the body. Hypertension forces the heart to work harder to pump blood and may cause arteries to become narrow or stiff. Untreated or uncontrolled hypertension can cause a heart attack, heart failure, a stroke, kidney disease, and other problems. A blood pressure reading consists of a higher number over a lower number. Ideally, your blood pressure should be below 120/80. The first ("top") number is called the systolic pressure. It is a measure of the pressure in your arteries as your heart beats. The second ("bottom") number is called the diastolic pressure. It is a measure of the pressure in your arteries as the heart relaxes. What are the causes? The exact cause of this condition is not known. There are some conditions  that result in or are related to high blood pressure. What increases the risk? Some risk factors for high blood pressure are under your control. The following factors may make you more likely to develop this condition:  Smoking.  Having type 2 diabetes mellitus, high cholesterol, or both.  Not getting enough exercise or physical activity.  Being overweight.  Having too much fat, sugar, calories, or salt (sodium) in your diet.  Drinking too much alcohol. Some risk factors for high blood pressure may be difficult or impossible to change. Some of these factors include:  Having chronic kidney disease.  Having a family history of high blood pressure.  Age. Risk increases with age.  Race. You may be at higher risk if you are African American.  Gender. Men are at higher risk than women before age 45. After age 65, women are at higher risk than men.  Having obstructive sleep apnea.  Stress. What are the signs or symptoms? High blood pressure may not cause symptoms. Very high blood pressure (hypertensive crisis) may cause:  Headache.  Anxiety.  Shortness of breath.  Nosebleed.  Nausea and vomiting.  Vision changes.  Severe chest pain.  Seizures. How is this diagnosed? This condition is diagnosed by measuring your blood pressure while you are seated, with your arm resting on a flat surface, your legs uncrossed, and your feet flat on the floor. The cuff of the blood pressure monitor will be placed directly against the skin of your upper arm at the level of your   heart. It should be measured at least twice using the same arm. Certain conditions can cause a difference in blood pressure between your right and left arms. Certain factors can cause blood pressure readings to be lower or higher than normal for a short period of time:  When your blood pressure is higher when you are in a health care provider's office than when you are at home, this is called white coat hypertension.  Most people with this condition do not need medicines.  When your blood pressure is higher at home than when you are in a health care provider's office, this is called masked hypertension. Most people with this condition may need medicines to control blood pressure. If you have a high blood pressure reading during one visit or you have normal blood pressure with other risk factors, you may be asked to:  Return on a different day to have your blood pressure checked again.  Monitor your blood pressure at home for 1 week or longer. If you are diagnosed with hypertension, you may have other blood or imaging tests to help your health care provider understand your overall risk for other conditions. How is this treated? This condition is treated by making healthy lifestyle changes, such as eating healthy foods, exercising more, and reducing your alcohol intake. Your health care provider may prescribe medicine if lifestyle changes are not enough to get your blood pressure under control, and if:  Your systolic blood pressure is above 130.  Your diastolic blood pressure is above 80. Your personal target blood pressure may vary depending on your medical conditions, your age, and other factors. Follow these instructions at home: Eating and drinking   Eat a diet that is high in fiber and potassium, and low in sodium, added sugar, and fat. An example eating plan is called the DASH (Dietary Approaches to Stop Hypertension) diet. To eat this way: ? Eat plenty of fresh fruits and vegetables. Try to fill one half of your plate at each meal with fruits and vegetables. ? Eat whole grains, such as whole-wheat pasta, brown rice, or whole-grain bread. Fill about one fourth of your plate with whole grains. ? Eat or drink low-fat dairy products, such as skim milk or low-fat yogurt. ? Avoid fatty cuts of meat, processed or cured meats, and poultry with skin. Fill about one fourth of your plate with lean proteins, such  as fish, chicken without skin, beans, eggs, or tofu. ? Avoid pre-made and processed foods. These tend to be higher in sodium, added sugar, and fat.  Reduce your daily sodium intake. Most people with hypertension should eat less than 1,500 mg of sodium a day.  Do not drink alcohol if: ? Your health care provider tells you not to drink. ? You are pregnant, may be pregnant, or are planning to become pregnant.  If you drink alcohol: ? Limit how much you use to:  0-1 drink a day for women.  0-2 drinks a day for men. ? Be aware of how much alcohol is in your drink. In the [place] one drink equals one 12 oz bottle of beer (355 mL), one 5 oz glass of wine (148 mL), or one 1 oz glass of hard liquor (44 mL). Lifestyle   Work with your health care provider to maintain a healthy body weight or to lose weight. Ask what an ideal weight is for you.  Get at least 30 minutes of exercise most days of the week. Activities may include walking, swimming,   or biking.  Include exercise to strengthen your muscles (resistance exercise), such as Pilates or lifting weights, as part of your weekly exercise routine. Try to do these types of exercises for 30 minutes at least 3 days a week.  Do not use any products that contain nicotine or tobacco, such as cigarettes, e-cigarettes, and chewing tobacco. If you need help quitting, ask your health care provider.  Monitor your blood pressure at home as told by your health care provider.  Keep all follow-up visits as told by your health care provider. This is important. Medicines  Take over-the-counter and prescription medicines only as told by your health care provider. Follow directions carefully. Blood pressure medicines must be taken as prescribed.  Do not skip doses of blood pressure medicine. Doing this puts you at risk for problems and can make the medicine less effective.  Ask your health care provider about side effects or reactions to medicines that you  should watch for. Contact a health care provider if you:  Think you are having a reaction to a medicine you are taking.  Have headaches that keep coming back (recurring).  Feel dizzy.  Have swelling in your ankles.  Have trouble with your vision. Get help right away if you:  Develop a severe headache or confusion.  Have unusual weakness or numbness.  Feel faint.  Have severe pain in your chest or abdomen.  Vomit repeatedly.  Have trouble breathing. Summary  Hypertension is when the force of blood pumping through your arteries is too strong. If this condition is not controlled, it may put you at risk for serious complications.  Your personal target blood pressure may vary depending on your medical conditions, your age, and other factors. For most people, a normal blood pressure is less than 120/80.  Hypertension is treated with lifestyle changes, medicines, or a combination of both. Lifestyle changes include losing weight, eating a healthy, low-sodium diet, exercising more, and limiting alcohol. This information is not intended to replace advice given to you by your health care provider. Make sure you discuss any questions you have with your health care provider. Document Revised: 08/22/2018 Document Reviewed: 08/22/2018 [employer] Patient Education  2020 [employer]  

## 2020-12-01 NOTE — Assessment & Plan Note (Signed)
Normal blood pressure readings at home.  Continue amlodipine 10 mg daily. Continue to monitor blood pressure readings at home. Diet and nutrition discussed. Continue rosuvastatin 20 mg daily. Blood work while fasting this week. Follow-up in 6 months.

## 2020-12-01 NOTE — Progress Notes (Signed)
Shane Phillips 64 y.o.   Chief Complaint  Patient presents with  . Hypertension    follow up 6 month with labs to check cholesterol    HISTORY OF PRESENT ILLNESS: This is a 64 y.o. male with history of hypertension and dyslipidemia here for follow-up. Doing well.  Has no complaints or medical concerns today. Presently taking rosuvastatin 20 mg daily and amlodipine 10 mg daily. Also has a history of seizure disorder on Tegretol 400 mg at bedtime.  No complaints. Fully vaccinated against Covid with a booster. Lab Results  Component Value Date   CHOL 200 (H) 05/28/2020   HDL 65 05/28/2020   LDLCALC 123 (H) 05/28/2020   TRIG 64 05/28/2020   CHOLHDL 3.1 05/28/2020   Lab Results  Component Value Date   CREATININE 1.00 05/28/2020   BUN 17 05/28/2020   NA 140 05/28/2020   K 4.0 05/28/2020   CL 101 05/28/2020   CO2 26 05/28/2020   The 10-year ASCVD risk score Mikey Bussing DC Jr., et al., 2013) is: 14.3%   Values used to calculate the score:     Age: 62 years     Sex: Male     Is Non-Hispanic African American: No     Diabetic: No     Tobacco smoker: No     Systolic Blood Pressure: 599 mmHg     Is BP treated: Yes     HDL Cholesterol: 65 mg/dL     Total Cholesterol: 200 mg/dL  HPI   Prior to Admission medications   Medication Sig Start Date End Date Taking? Authorizing Provider  amLODipine (NORVASC) 10 MG tablet TAKE ONE TABLET BY MOUTH DAILY 10/23/20  Yes Raye Wiens, [name], MD  carbamazepine (EPITOL) 200 MG tablet Take 2 tablets (400 mg total) by mouth at bedtime. 11/03/20  Yes Aura Bibby, [name], MD  co-enzyme Q-10 30 MG capsule Take 30 mg by mouth daily.   Yes [provider]  GARLIC PO Take by mouth daily.   Yes [provider]  GLUCOSAMINE-CHONDROITIN PO Take by mouth daily.   Yes [provider]  Omega-3 Fatty Acids (FISH OIL PO) Take by mouth daily.   Yes [provider]  Red Yeast Rice Extract (RED YEAST RICE PO) Take by mouth  daily.   Yes [provider]  rosuvastatin (CRESTOR) 20 MG tablet Take 1 tablet (20 mg total) by mouth daily. 11/19/19 02/17/20  Horald Pollen, MD    Not on File  Patient Active Problem List   Diagnosis Date Noted  . Dyslipidemia 11/19/2019  . Hyperlipidemia 08/19/2019  . Essential hypertension 09/05/2017  . Localization-related focal epilepsy with simple partial seizures ([place]) 08/05/2013  . Generalized convulsive epilepsy ([place]) 08/05/2013    Past Medical History:  Diagnosis Date  . Arthritis    hands   . High cholesterol   . Hypertension   . Seizure ([place])    last sz 2001    Past Surgical History:  Procedure Laterality Date  . COLONOSCOPY  10/15/2007  . None      Social History   Socioeconomic History  . Marital status: Married    Spouse name: [name]  . Number of children: Not on file  . Years of education: Not on file  . Highest education level: Not on file  Occupational History  . Occupation: Self Empolyed  Tobacco Use  . Smoking status: Never Smoker  . Smokeless tobacco: Never Used  Substance and Sexual Activity  . Alcohol use: No  .  Drug use: Yes    Types: "Crack" cocaine  . Sexual activity: Yes  Other Topics Concern  . 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   Patient is Married to [place].       Social Determinants of Health   Financial Resource Strain:   . Difficulty of Paying Living Expenses: Not on file  Food Insecurity:   . Worried About Charity fundraiser in the Last Year: Not on file  . Ran Out of Food in the Last Year: Not on file  Transportation Needs:   . Lack of Transportation (Medical): Not on file  . Lack of Transportation (Non-Medical): Not on file  Physical Activity:   . Days of Exercise per Week: Not on file  . Minutes of Exercise per Session: Not on file  Stress:   . Feeling of Stress : Not on file  Social Connections:   . Frequency of Communication with Friends  and Family: Not on file  . Frequency of Social Gatherings with Friends and Family: Not on file  . Attends Religious Services: Not on file  . Active Member of Clubs or Organizations: Not on file  . Attends Archivist Meetings: Not on file  . Marital Status: Not on file  Intimate Partner Violence:   . Fear of Current or Ex-Partner: Not on file  . Emotionally Abused: Not on file  . Physically Abused: Not on file  . Sexually Abused: Not on file    Family History  Problem Relation Age of Onset  . Leukemia Mother   . Cancer Mother   . Heart attack Brother        2 in 6 months   . Hyperlipidemia Brother   . Cancer Sister        breast cancer  . Hyperlipidemia Sister   . Breast cancer Sister   . Cancer Father        bladder cancer  . Cancer Maternal Grandfather        lung cancer  . Cancer Paternal Grandfather        melanoma  . Hyperlipidemia Sister   . Colon cancer Neg Hx   . Colon polyps Neg Hx   . Esophageal cancer Neg Hx   . Rectal cancer Neg Hx   . Stomach cancer Neg Hx      Review of Systems  Constitutional: Negative.  Negative for chills and fever.  HENT: Negative.  Negative for congestion and sore throat.   Respiratory: Negative.  Negative for cough and shortness of breath.   Cardiovascular: Negative.  Negative for chest pain and palpitations.  Gastrointestinal: Negative.  Negative for abdominal pain, blood in stool, diarrhea, melena, nausea and vomiting.  Genitourinary: Negative.  Negative for dysuria and hematuria.  Musculoskeletal: Negative.   Skin: Negative.  Negative for rash.  Neurological: Negative.  Negative for dizziness and headaches.  All other systems reviewed and are negative.   Today's Vitals   12/01/20 1438  BP: (!) 151/82  Pulse: 60  Resp: 16  Temp: 98 F (36.7 C)  TempSrc: Temporal  SpO2: 97%  Weight: 190 lb (86.2 kg)  Height: 5\' 11"  (1.803 m)   Body mass index is 26.5 kg/m. BP Readings from Last 3 Encounters:  12/01/20  (!) 151/82  05/28/20 125/76  11/19/19 127/80   Wt Readings from Last 3 Encounters:  12/01/20 190 lb (86.2 kg)  05/28/20 183 lb (83 kg)  11/19/19 186 lb  12.8 oz (84.7 kg)    Physical Exam Vitals reviewed.  Constitutional:      Appearance: Normal appearance.  HENT:     Head: Normocephalic.  Eyes:     Extraocular Movements: Extraocular movements intact.     Conjunctiva/sclera: Conjunctivae normal.     Pupils: Pupils are equal, round, and reactive to light.  Cardiovascular:     Rate and Rhythm: Normal rate and regular rhythm.     Pulses: Normal pulses.     Heart sounds: Normal heart sounds.  Pulmonary:     Effort: Pulmonary effort is normal.     Breath sounds: Normal breath sounds.  Abdominal:     General: There is no distension.     Palpations: Abdomen is soft.     Tenderness: There is no abdominal tenderness.  Musculoskeletal:        General: Normal range of motion.     Cervical back: Normal range of motion and neck supple.  Skin:    General: Skin is warm and dry.     Capillary Refill: Capillary refill takes less than 2 seconds.  Neurological:     General: No focal deficit present.     Mental Status: He is alert and oriented to person, place, and time.  Psychiatric:        Mood and Affect: Mood normal.        Behavior: Behavior normal.      ASSESSMENT & PLAN: Clinically stable.  No medical concerns identified during this visit. Essential hypertension Normal blood pressure readings at home.  Continue amlodipine 10 mg daily. Continue to monitor blood pressure readings at home. Diet and nutrition discussed. Continue rosuvastatin 20 mg daily. Blood work while fasting this week. Follow-up in 6 months.  Ercole was seen today for hypertension.  Diagnoses and all orders for this visit:  Essential hypertension -     Comprehensive metabolic panel; Future -     Hemoglobin A1c; Future  Dyslipidemia -     Lipid panel; Future -     Hemoglobin A1c; Future   Generalized convulsive epilepsy [facility])    Patient Instructions       If you have lab work done today you will be contacted with your lab results within the next 2 weeks.  If you have not heard from [place] then please contact us. The fastest way to get your results is to register for My Chart.   IF you received an x-ray today, you will receive an invoice from [facility]. Please contact [facility] at [phone] with questions or concerns regarding your invoice.   IF you received labwork today, you will receive an invoice from [facility]. Please contact [facility] at [phone] with questions or concerns regarding your invoice.   Our billing staff will not be able to assist you with questions regarding bills from these companies.  You will be contacted with the lab results as soon as they are available. The fastest way to get your results is to activate your My Chart account. Instructions are located on the last page of this paperwork. If you have not heard from [place] regarding the results in 2 weeks, please contact this office.     Hypertension, Adult High blood pressure (hypertension) is when the force of blood pumping through the arteries is too strong. The arteries are the blood vessels that carry blood from the heart throughout the body. Hypertension forces the heart to work harder to pump blood and may cause arteries to become narrow  or stiff. Untreated or uncontrolled hypertension can cause a heart attack, heart failure, a stroke, kidney disease, and other problems. A blood pressure reading consists of a higher number over a lower number. Ideally, your blood pressure should be below 120/80. The first ("top") number is called the systolic pressure. It is a measure of the pressure in your arteries as your heart beats. The second ("bottom") number is called the diastolic pressure. It is a measure of the pressure in your arteries as the heart relaxes. What are the causes? The  exact cause of this condition is not known. There are some conditions that result in or are related to high blood pressure. What increases the risk? Some risk factors for high blood pressure are under your control. The following factors may make you more likely to develop this condition:  Smoking.  Having type 2 diabetes mellitus, high cholesterol, or both.  Not getting enough exercise or physical activity.  Being overweight.  Having too much fat, sugar, calories, or salt (sodium) in your diet.  Drinking too much alcohol. Some risk factors for high blood pressure may be difficult or impossible to change. Some of these factors include:  Having chronic kidney disease.  Having a family history of high blood pressure.  Age. Risk increases with age.  Race. You may be at higher risk if you are African American.  Gender. Men are at higher risk than women before age 74. After age 78, women are at higher risk than men.  Having obstructive sleep apnea.  Stress. What are the signs or symptoms? High blood pressure may not cause symptoms. Very high blood pressure (hypertensive crisis) may cause:  Headache.  Anxiety.  Shortness of breath.  Nosebleed.  Nausea and vomiting.  Vision changes.  Severe chest pain.  Seizures. How is this diagnosed? This condition is diagnosed by measuring your blood pressure while you are seated, with your arm resting on a flat surface, your legs uncrossed, and your feet flat on the floor. The cuff of the blood pressure monitor will be placed directly against the skin of your upper arm at the level of your heart. It should be measured at least twice using the same arm. Certain conditions can cause a difference in blood pressure between your right and left arms. Certain factors can cause blood pressure readings to be lower or higher than normal for a short period of time:  When your blood pressure is higher when you are in a health care provider's  office than when you are at home, this is called white coat hypertension. Most people with this condition do not need medicines.  When your blood pressure is higher at home than when you are in a health care provider's office, this is called masked hypertension. Most people with this condition may need medicines to control blood pressure. If you have a high blood pressure reading during one visit or you have normal blood pressure with other risk factors, you may be asked to:  Return on a different day to have your blood pressure checked again.  Monitor your blood pressure at home for 1 week or longer. If you are diagnosed with hypertension, you may have other blood or imaging tests to help your health care provider understand your overall risk for other conditions. How is this treated? This condition is treated by making healthy lifestyle changes, such as eating healthy foods, exercising more, and reducing your alcohol intake. Your health care provider may prescribe medicine if lifestyle changes  are not enough to get your blood pressure under control, and if:  Your systolic blood pressure is above 130.  Your diastolic blood pressure is above 80. Your personal target blood pressure may vary depending on your medical conditions, your age, and other factors. Follow these instructions at home: Eating and drinking   Eat a diet that is high in fiber and potassium, and low in sodium, added sugar, and fat. An example eating plan is called the DASH (Dietary Approaches to Stop Hypertension) diet. To eat this way: ? Eat plenty of fresh fruits and vegetables. Try to fill one half of your plate at each meal with fruits and vegetables. ? Eat whole grains, such as whole-wheat pasta, brown rice, or whole-grain bread. Fill about one fourth of your plate with whole grains. ? Eat or drink low-fat dairy products, such as skim milk or low-fat yogurt. ? Avoid fatty cuts of meat, processed or cured meats, and  poultry with skin. Fill about one fourth of your plate with lean proteins, such as fish, chicken without skin, beans, eggs, or tofu. ? Avoid pre-made and processed foods. These tend to be higher in sodium, added sugar, and fat.  Reduce your daily sodium intake. Most people with hypertension should eat less than 1,500 mg of sodium a day.  Do not drink alcohol if: ? Your health care provider tells you not to drink. ? You are pregnant, may be pregnant, or are planning to become pregnant.  If you drink alcohol: ? Limit how much you use to:  0-1 drink a day for women.  0-2 drinks a day for men. ? Be aware of how much alcohol is in your drink. In the [place] one drink equals one 12 oz bottle of beer (355 mL), one 5 oz glass of wine (148 mL), or one 1 oz glass of hard liquor (44 mL). Lifestyle   Work with your health care provider to maintain a healthy body weight or to lose weight. Ask what an ideal weight is for you.  Get at least 30 minutes of exercise most days of the week. Activities may include walking, swimming, or biking.  Include exercise to strengthen your muscles (resistance exercise), such as Pilates or lifting weights, as part of your weekly exercise routine. Try to do these types of exercises for 30 minutes at least 3 days a week.  Do not use any products that contain nicotine or tobacco, such as cigarettes, e-cigarettes, and chewing tobacco. If you need help quitting, ask your health care provider.  Monitor your blood pressure at home as told by your health care provider.  Keep all follow-up visits as told by your health care provider. This is important. Medicines  Take over-the-counter and prescription medicines only as told by your health care provider. Follow directions carefully. Blood pressure medicines must be taken as prescribed.  Do not skip doses of blood pressure medicine. Doing this puts you at risk for problems and can make the medicine less effective.  Ask your  health care provider about side effects or reactions to medicines that you should watch for. Contact a health care provider if you:  Think you are having a reaction to a medicine you are taking.  Have headaches that keep coming back (recurring).  Feel dizzy.  Have swelling in your ankles.  Have trouble with your vision. Get help right away if you:  Develop a severe headache or confusion.  Have unusual weakness or numbness.  Feel faint.  Have severe  pain in your chest or abdomen.  Vomit repeatedly.  Have trouble breathing. Summary  Hypertension is when the force of blood pumping through your arteries is too strong. If this condition is not controlled, it may put you at risk for serious complications.  Your personal target blood pressure may vary depending on your medical conditions, your age, and other factors. For most people, a normal blood pressure is less than 120/80.  Hypertension is treated with lifestyle changes, medicines, or a combination of both. Lifestyle changes include losing weight, eating a healthy, low-sodium diet, exercising more, and limiting alcohol. This information is not intended to replace advice given to you by your health care provider. Make sure you discuss any questions you have with your health care provider. Document Revised: 08/22/2018 Document Reviewed: 08/22/2018 Elsevier Patient Education  2020 Elsevier Inc.      Agustina Caroli, MD [facility]

## 2020-12-01 NOTE — Telephone Encounter (Signed)
Pt is getting his cpe on 06/01/21 and his labs for this visit on 05/27/21. Please put lab orders in.

## 2020-12-02 ENCOUNTER — Other Ambulatory Visit: Payer: Self-pay

## 2020-12-02 ENCOUNTER — Ambulatory Visit (INDEPENDENT_AMBULATORY_CARE_PROVIDER_SITE_OTHER): Payer: 59 | Admitting: Emergency Medicine

## 2020-12-02 DIAGNOSIS — I10 Essential (primary) hypertension: Secondary | ICD-10-CM

## 2020-12-02 DIAGNOSIS — E785 Hyperlipidemia, unspecified: Secondary | ICD-10-CM

## 2020-12-02 DIAGNOSIS — Z125 Encounter for screening for malignant neoplasm of prostate: Secondary | ICD-10-CM

## 2020-12-02 NOTE — Telephone Encounter (Signed)
Labs added for pt cpe 06/01/21

## 2020-12-03 LAB — COMPREHENSIVE METABOLIC PANEL
ALT: 20 IU/L (ref 0–44)
AST: 17 IU/L (ref 0–40)
Albumin/Globulin Ratio: 1.7 (ref 1.2–2.2)
Albumin: 4.3 g/dL (ref 3.8–4.8)
Alkaline Phosphatase: 89 IU/L (ref 44–121)
BUN/Creatinine Ratio: 16 (ref 10–24)
BUN: 16 mg/dL (ref 8–27)
Bilirubin Total: 0.3 mg/dL (ref 0.0–1.2)
CO2: 24 mmol/L (ref 20–29)
Calcium: 9 mg/dL (ref 8.6–10.2)
Chloride: 102 mmol/L (ref 96–106)
Creatinine, Ser: 1.01 mg/dL (ref 0.76–1.27)
GFR calc Af Amer: 91 mL/min/{1.73_m2} (ref >59)
GFR calc non Af Amer: 79 mL/min/{1.73_m2} (ref >59)
Globulin, Total: 2.6 g/dL (ref 1.5–4.5)
Glucose: 86 mg/dL (ref 65–99)
Potassium: 4 mmol/L (ref 3.5–5.2)
Sodium: 142 mmol/L (ref 134–144)
Total Protein: 6.9 g/dL (ref 6.0–8.5)

## 2020-12-03 LAB — LIPID PANEL
Chol/HDL Ratio: 2.8 ratio (ref 0.0–5.0)
Cholesterol, Total: 196 mg/dL (ref 100–199)
HDL: 69 mg/dL (ref >39)
LDL Chol Calc (NIH): 119 mg/dL — ABNORMAL HIGH (ref 0–99)
Triglycerides: 44 mg/dL (ref 0–149)
VLDL Cholesterol Cal: 8 mg/dL (ref 5–40)

## 2020-12-03 LAB — HEMOGLOBIN A1C
Est. average glucose Bld gHb Est-mCnc: 111 mg/dL
Hgb A1c MFr Bld: 5.5 % (ref 4.8–5.6)

## 2020-12-07 ENCOUNTER — Other Ambulatory Visit: Payer: Self-pay | Admitting: Emergency Medicine

## 2020-12-07 NOTE — Telephone Encounter (Signed)
Requested medication (s) are due for refill today: Yes  Requested medication (s) are on the active medication list: Yes  Last refill:  11/19/19  Future visit scheduled: Yes  Notes to clinic:  Unable to refill per protocol, expired Rx     Requested Prescriptions  Pending Prescriptions Disp Refills   rosuvastatin (CRESTOR) 20 MG tablet [Pharmacy Med Name: ROSUVASTATIN CALCIUM 20 MG TAB] 90 tablet 3    Sig: TAKE ONE TABLET BY MOUTH DAILY      Cardiovascular:  Antilipid - Statins Failed - 12/07/2020  6:00 PM      Failed - LDL in normal range and within 360 days    LDL Chol Calc (NIH)  Date Value Ref Range Status  12/02/2020 119 (H) 0 - 99 mg/dL Final          Passed - Total Cholesterol in normal range and within 360 days    Cholesterol, Total  Date Value Ref Range Status  12/02/2020 196 100 - 199 mg/dL Final          Passed - HDL in normal range and within 360 days    HDL  Date Value Ref Range Status  12/02/2020 69 >39 mg/dL Final          Passed - Triglycerides in normal range and within 360 days    Triglycerides  Date Value Ref Range Status  12/02/2020 44 0 - 149 mg/dL Final          Passed - Patient is not pregnant      Passed - Valid encounter within last 12 months    Recent Outpatient Visits           5 days ago Essential hypertension   [facility] [place], [name], MD   6 days ago Essential hypertension   [facility] [place], [name], MD   6 months ago Essential hypertension   [facility] [place], [name], MD   1 year ago Essential hypertension   [facility] [place], [name], MD   1 year ago Essential hypertension   [facility] at [facility], [name], MD       Future Appointments             In 5 months [place], [name], MD [facility] [place], [facility]

## 2020-12-08 NOTE — Telephone Encounter (Signed)
Refilled medication for 90 days, 0 refills.

## 2021-03-29 ENCOUNTER — Other Ambulatory Visit: Payer: Self-pay | Admitting: Emergency Medicine

## 2021-05-19 ENCOUNTER — Other Ambulatory Visit: Payer: Self-pay | Admitting: Emergency Medicine

## 2021-05-21 ENCOUNTER — Other Ambulatory Visit: Payer: Self-pay | Admitting: Emergency Medicine

## 2021-05-21 ENCOUNTER — Telehealth: Payer: Self-pay | Admitting: Emergency Medicine

## 2021-05-21 DIAGNOSIS — I10 Essential (primary) hypertension: Secondary | ICD-10-CM

## 2021-05-21 DIAGNOSIS — Z125 Encounter for screening for malignant neoplasm of prostate: Secondary | ICD-10-CM

## 2021-05-21 DIAGNOSIS — G40309 Generalized idiopathic epilepsy and epileptic syndromes, not intractable, without status epilepticus: Secondary | ICD-10-CM

## 2021-05-21 DIAGNOSIS — E785 Hyperlipidemia, unspecified: Secondary | ICD-10-CM

## 2021-05-21 NOTE — Telephone Encounter (Signed)
Patient has a lab appt 6/2 and a CPE appt with Dr. Mitchel Honour 6/7.   Please add lab orders for the 6/2 lab visit.

## 2021-05-21 NOTE — Telephone Encounter (Signed)
Labs ordered for future visit.  Thanks.

## 2021-05-27 ENCOUNTER — Other Ambulatory Visit: Payer: Self-pay

## 2021-05-27 ENCOUNTER — Other Ambulatory Visit (INDEPENDENT_AMBULATORY_CARE_PROVIDER_SITE_OTHER): Payer: 59

## 2021-05-27 DIAGNOSIS — E785 Hyperlipidemia, unspecified: Secondary | ICD-10-CM | POA: Diagnosis not present

## 2021-05-27 DIAGNOSIS — G40309 Generalized idiopathic epilepsy and epileptic syndromes, not intractable, without status epilepticus: Secondary | ICD-10-CM | POA: Diagnosis not present

## 2021-05-27 DIAGNOSIS — Z125 Encounter for screening for malignant neoplasm of prostate: Secondary | ICD-10-CM | POA: Diagnosis not present

## 2021-05-27 DIAGNOSIS — I10 Essential (primary) hypertension: Secondary | ICD-10-CM

## 2021-05-27 LAB — CBC WITH DIFFERENTIAL/PLATELET
Basophils Absolute: 0 10*3/uL (ref 0.0–0.1)
Basophils Relative: 0.3 % (ref 0.0–3.0)
Eosinophils Absolute: 0.1 10*3/uL (ref 0.0–0.7)
Eosinophils Relative: 1.6 % (ref 0.0–5.0)
HCT: 40 % (ref 39.0–52.0)
Hemoglobin: 13.8 g/dL (ref 13.0–17.0)
Lymphocytes Relative: 28.8 % (ref 12.0–46.0)
Lymphs Abs: 1.5 10*3/uL (ref 0.7–4.0)
MCHC: 34.6 g/dL (ref 30.0–36.0)
MCV: 89.7 fl (ref 78.0–100.0)
Monocytes Absolute: 0.7 10*3/uL (ref 0.1–1.0)
Monocytes Relative: 12.3 % — ABNORMAL HIGH (ref 3.0–12.0)
Neutro Abs: 3 10*3/uL (ref 1.4–7.7)
Neutrophils Relative %: 57 % (ref 43.0–77.0)
Platelets: 193 10*3/uL (ref 150.0–400.0)
RBC: 4.45 Mil/uL (ref 4.22–5.81)
RDW: 12.9 % (ref 11.5–15.5)
WBC: 5.3 10*3/uL (ref 4.0–10.5)

## 2021-05-27 LAB — PSA: PSA: 1.78 ng/mL (ref 0.10–4.00)

## 2021-05-27 LAB — HEMOGLOBIN A1C: Hgb A1c MFr Bld: 5.9 % (ref 4.6–6.5)

## 2021-05-28 LAB — COMPREHENSIVE METABOLIC PANEL
ALT: 20 U/L (ref 0–53)
AST: 18 U/L (ref 0–37)
Albumin: 4.5 g/dL (ref 3.5–5.2)
Alkaline Phosphatase: 81 U/L (ref 39–117)
BUN: 23 mg/dL (ref 6–23)
CO2: 26 mEq/L (ref 19–32)
Calcium: 9.4 mg/dL (ref 8.4–10.5)
Chloride: 104 mEq/L (ref 96–112)
Creatinine, Ser: 0.97 mg/dL (ref 0.40–1.50)
GFR: 82.67 mL/min (ref >60.00)
Glucose, Bld: 89 mg/dL (ref 70–99)
Potassium: 3.7 mEq/L (ref 3.5–5.1)
Sodium: 143 mEq/L (ref 135–145)
Total Bilirubin: 0.3 mg/dL (ref 0.2–1.2)
Total Protein: 7.2 g/dL (ref 6.0–8.3)

## 2021-05-28 LAB — LIPID PANEL
Cholesterol: 197 mg/dL (ref 0–200)
HDL: 60.3 mg/dL (ref >39.00)
LDL Cholesterol: 123 mg/dL — ABNORMAL HIGH (ref 0–99)
NonHDL: 136.41
Total CHOL/HDL Ratio: 3
Triglycerides: 68 mg/dL (ref 0.0–149.0)
VLDL: 13.6 mg/dL (ref 0.0–40.0)

## 2021-06-01 ENCOUNTER — Encounter: Payer: Self-pay | Admitting: Emergency Medicine

## 2021-06-15 ENCOUNTER — Other Ambulatory Visit: Payer: Self-pay

## 2021-06-16 ENCOUNTER — Other Ambulatory Visit: Payer: Self-pay

## 2021-06-16 ENCOUNTER — Encounter: Payer: Self-pay | Admitting: Emergency Medicine

## 2021-06-16 ENCOUNTER — Ambulatory Visit (INDEPENDENT_AMBULATORY_CARE_PROVIDER_SITE_OTHER): Payer: 59 | Admitting: Emergency Medicine

## 2021-06-16 VITALS — BP 138/80 | HR 64 | Temp 98.6°F | Ht 71.0 in | Wt 189.6 lb

## 2021-06-16 DIAGNOSIS — Z0000 Encounter for general adult medical examination without abnormal findings: Secondary | ICD-10-CM

## 2021-06-16 DIAGNOSIS — E785 Hyperlipidemia, unspecified: Secondary | ICD-10-CM | POA: Diagnosis not present

## 2021-06-16 DIAGNOSIS — G40309 Generalized idiopathic epilepsy and epileptic syndromes, not intractable, without status epilepticus: Secondary | ICD-10-CM | POA: Diagnosis not present

## 2021-06-16 DIAGNOSIS — I10 Essential (primary) hypertension: Secondary | ICD-10-CM

## 2021-06-16 NOTE — Progress Notes (Signed)
Shane Phillips 65 y.o.   Chief Complaint  Patient presents with   Annual Exam    HISTORY OF PRESENT ILLNESS: This is a 65 y.o. male here for his annual exam. Has history of hypertension: Amlodipine 10 mg daily History of seizures since he was a teenager on carbamazepine 200 mg daily History of dyslipidemia on rosuvastatin 20 mg daily No complaints or medical concerns today. Recent blood work results show unremarkable labs with acceptable values.  Normal CMP and normal CBC.  Normal PSA and normal hemoglobin A1c.  Lipid profile shows some elevation of LDL cholesterol with normal total cholesterol and normal triglycerides. A1A  HPI   Prior to Admission medications   Medication Sig Start Date End Date Taking? Authorizing Provider  amLODipine (NORVASC) 10 MG tablet TAKE ONE TABLET BY MOUTH DAILY 05/19/21  Yes Shane Phillips, Shane Bloomer, MD  carbamazepine (EPITOL) 200 MG tablet Take 2 tablets (400 mg total) by mouth at bedtime. 11/03/20  Yes Shane Phillips, Shane Bloomer, MD  co-enzyme Q-10 30 MG capsule Take 30 mg by mouth daily.   Yes [provider]  GARLIC PO Take by mouth daily.   Yes [provider]  GLUCOSAMINE-CHONDROITIN PO Take by mouth daily.   Yes [provider]  Omega-3 Fatty Acids (FISH OIL PO) Take by mouth daily.   Yes [provider]  rosuvastatin (CRESTOR) 20 MG tablet TAKE ONE TABLET BY MOUTH DAILY 05/19/21  Yes Shane Pollen, MD    No Known Allergies  Patient Active Problem List   Diagnosis Date Noted   Dyslipidemia 11/19/2019   Hyperlipidemia 08/19/2019   Essential hypertension 09/05/2017   Localization-related focal epilepsy with simple partial seizures (Shane Phillips) 08/05/2013   Generalized convulsive epilepsy (Shane Phillips) 08/05/2013    Past Medical History:  Diagnosis Date   Arthritis    hands    High cholesterol    Hypertension    Seizure ([place])    last sz 2001    Past Surgical History:  Procedure Laterality Date   COLONOSCOPY   10/15/2007   None      Social History   Socioeconomic History   Marital status: Married    Spouse name: Shane Phillips   Number of children: Not on file   Years of education: Not on file   Highest education level: Not on file  Occupational History   Occupation: Self Empolyed  Tobacco Use   Smoking status: Never   Smokeless tobacco: Never  Substance and Sexual Activity   Alcohol use: No   Drug use: Yes    Types: "Crack" cocaine   Sexual activity: Yes  Other Topics Concern   Not on file  Social History Narrative   Patient owns a [employer].   Patient is Married to [place].       Social Determinants of Health   Financial Resource Strain: Not on file  Food Insecurity: Not on file  Transportation Needs: Not on file  Physical Activity: Not on file  Stress: Not on file  Social Connections: Not on file  Intimate Partner Violence: Not on file    Family History  Problem Relation Age of Onset   Leukemia Mother    Cancer Mother    Heart attack Brother        2 in 60 months    Hyperlipidemia Brother    Cancer Sister        breast cancer   Hyperlipidemia Sister    Breast cancer Sister    Cancer Father  bladder cancer   Cancer Maternal Grandfather        lung cancer   Cancer Paternal Grandfather        melanoma   Hyperlipidemia Sister    Colon cancer Neg Hx    Colon polyps Neg Hx    Esophageal cancer Neg Hx    Rectal cancer Neg Hx    Stomach cancer Neg Hx      Review of Systems  Constitutional: Negative.  Negative for chills and fever.  HENT:  Negative for congestion and sore throat.   Respiratory: Negative.    Cardiovascular: Negative.  Negative for chest pain and palpitations.  Gastrointestinal:  Negative for abdominal pain, diarrhea, nausea and vomiting.  Genitourinary: Negative.  Negative for dysuria and hematuria.  Skin: Negative.  Negative for rash.  Neurological:  Negative for dizziness and headaches.  All other systems reviewed and are  negative.  Vitals:   06/16/21 1406  BP: 138/80  Pulse: 64  Temp: 98.6 F (37 C)  SpO2: 96%   Wt Readings from Last 3 Encounters:  06/16/21 189 lb 9.6 oz (86 kg)  12/01/20 190 lb (86.2 kg)  05/28/20 183 lb (83 kg)    Physical Exam Vitals reviewed.  Constitutional:      Appearance: Normal appearance.  HENT:     Head: Normocephalic.     Right Ear: Tympanic membrane, ear canal and external ear normal.     Left Ear: Tympanic membrane, ear canal and external ear normal.  Eyes:     Extraocular Movements: Extraocular movements intact.     Conjunctiva/sclera: Conjunctivae normal.     Pupils: Pupils are equal, round, and reactive to light.  Cardiovascular:     Rate and Rhythm: Normal rate and regular rhythm.     Pulses: Normal pulses.     Heart sounds: Normal heart sounds.  Pulmonary:     Effort: Pulmonary effort is normal.     Breath sounds: Normal breath sounds.  Abdominal:     General: Bowel sounds are normal. There is no distension.     Palpations: Abdomen is soft.     Tenderness: There is no abdominal tenderness.  Musculoskeletal:        General: Normal range of motion.     Cervical back: Normal range of motion and neck supple.  Skin:    General: Skin is warm and dry.     Capillary Refill: Capillary refill takes less than 2 seconds.  Neurological:     General: No focal deficit present.     Mental Status: He is alert and oriented to person, place, and time.  Psychiatric:        Mood and Affect: Mood normal.        Behavior: Behavior normal.     ASSESSMENT & PLAN: Shane Phillips was seen today for annual exam.  Diagnoses and all orders for this visit:  Routine general medical examination at a health care facility  Essential hypertension  Dyslipidemia  Generalized convulsive epilepsy ([place])  Modifiable risk factors discussed with patient. Anticipatory guidance according to age provided. The following topics were discussed: Smoking Hypertension and cardiovascular  risks associated with this condition Diet and nutrition and need to reduce amount of daily carbohydrate intake Benefits of exercise Cancer screening and review of colonoscopy done in 2018 which showed 2 sessile polyps Vaccinations Cardiovascular risk assessment Mental health including depression and anxiety and mind/body connection Fall and accident prevention Review of all medications including amlodipine, carbamazepine, and rosuvastatin  Patient Instructions  Health Maintenance, Male Adopting a healthy lifestyle and getting preventive care are important in promoting health and wellness. Ask your health care provider about: The right schedule for you to have regular tests and exams. Things you can do on your own to prevent diseases and keep yourself healthy. What should I know about diet, weight, and exercise? Eat a healthy diet  Eat a diet that includes plenty of vegetables, fruits, low-fat dairy products, and lean protein. Do not eat a lot of foods that are high in solid fats, added sugars, or sodium.  Maintain a healthy weight Body mass index (BMI) is a measurement that can be used to identify possible weight problems. It estimates body fat based on height and weight. Your health care provider can help determine your BMI and help you achieve or maintain ahealthy weight. Get regular exercise Get regular exercise. This is one of the most important things you can do for your health. Most adults should: Exercise for at least 150 minutes each week. The exercise should increase your heart rate and make you sweat (moderate-intensity exercise). Do strengthening exercises at least twice a week. This is in addition to the moderate-intensity exercise. Spend less time sitting. Even light physical activity can be beneficial. Watch cholesterol and blood lipids Have your blood tested for lipids and cholesterol at 64 years of age, then havethis test every 5 years. You may need to have your  cholesterol levels checked more often if: Your lipid or cholesterol levels are high. You are older than 64 years of age. You are at high risk for heart disease. What should I know about cancer screening? Many types of cancers can be detected early and may often be prevented. Depending on your health history and family history, you may need to have cancer screening at various ages. This may include screening for: Colorectal cancer. Prostate cancer. Skin cancer. Lung cancer. What should I know about heart disease, diabetes, and high blood pressure? Blood pressure and heart disease High blood pressure causes heart disease and increases the risk of stroke. This is more likely to develop in people who have high blood pressure readings, are of African descent, or are overweight. Talk with your health care provider about your target blood pressure readings. Have your blood pressure checked: Every 3-5 years if you are 5-29 years of age. Every year if you are 43 years old or older. If you are between the ages of 52 and 49 and are a current or former smoker, ask your health care provider if you should have a one-time screening for abdominal aortic aneurysm (AAA). Diabetes Have regular diabetes screenings. This checks your fasting blood sugar level. Have the screening done: Once every three years after age 67 if you are at a normal weight and have a low risk for diabetes. More often and at a younger age if you are overweight or have a high risk for diabetes. What should I know about preventing infection? Hepatitis B If you have a higher risk for hepatitis B, you should be screened for this virus. Talk with your health care provider to find out if you are at risk forhepatitis B infection. Hepatitis C Blood testing is recommended for: Everyone born from [birth year] through 1965. Anyone with known risk factors for hepatitis C. Sexually transmitted infections (STIs) You should be screened each year for  STIs, including gonorrhea and chlamydia, if: You are sexually active and are younger than 64 years of age. You are older than 64 years of  age and your health care provider tells you that you are at risk for this type of infection. Your sexual activity has changed since you were last screened, and you are at increased risk for chlamydia or gonorrhea. Ask your health care provider if you are at risk. Ask your health care provider about whether you are at high risk for HIV. Your health care provider may recommend a prescription medicine to help prevent HIV infection. If you choose to take medicine to prevent HIV, you should first get tested for HIV. You should then be tested every 3 months for as long as you are taking the medicine. Follow these instructions at home: Lifestyle Do not use any products that contain nicotine or tobacco, such as cigarettes, e-cigarettes, and chewing tobacco. If you need help quitting, ask your health care provider. Do not use street drugs. Do not share needles. Ask your health care provider for help if you need support or information about quitting drugs. Alcohol use Do not drink alcohol if your health care provider tells you not to drink. If you drink alcohol: Limit how much you have to 0-2 drinks a day. Be aware of how much alcohol is in your drink. In the [place] one drink equals one 12 oz bottle of beer (355 mL), one 5 oz glass of wine (148 mL), or one 1 oz glass of hard liquor (44 mL). General instructions Schedule regular health, dental, and eye exams. Stay current with your vaccines. Tell your health care provider if: You often feel depressed. You have ever been abused or do not feel safe at home. Summary Adopting a healthy lifestyle and getting preventive care are important in promoting health and wellness. Follow your health care provider's instructions about healthy diet, exercising, and getting tested or screened for diseases. Follow your health care  provider's instructions on monitoring your cholesterol and blood pressure. This information is not intended to replace advice given to you by your health care provider. Make sure you discuss any questions you have with your healthcare provider. Document Revised: 12/05/2018 Document Reviewed: 12/05/2018 Elsevier Patient Education  2022 [place], MD [facility]

## 2021-06-16 NOTE — Patient Instructions (Signed)
Health Maintenance, Male Adopting a healthy lifestyle and getting preventive care are important in promoting health and wellness. Ask your health care provider about: The right schedule for you to have regular tests and exams. Things you can do on your own to prevent diseases and keep yourself healthy. What should I know about diet, weight, and exercise? Eat a healthy diet  Eat a diet that includes plenty of vegetables, fruits, low-fat dairy products, and lean protein. Do not eat a lot of foods that are high in solid fats, added sugars, or sodium.  Maintain a healthy weight Body mass index (BMI) is a measurement that can be used to identify possible weight problems. It estimates body fat based on height and weight. Your health care provider can help determine your BMI and help you achieve or maintain ahealthy weight. Get regular exercise Get regular exercise. This is one of the most important things you can do for your health. Most adults should: Exercise for at least 150 minutes each week. The exercise should increase your heart rate and make you sweat (moderate-intensity exercise). Do strengthening exercises at least twice a week. This is in addition to the moderate-intensity exercise. Spend less time sitting. Even light physical activity can be beneficial. Watch cholesterol and blood lipids Have your blood tested for lipids and cholesterol at 64 years of age, then havethis test every 5 years. You may need to have your cholesterol levels checked more often if: Your lipid or cholesterol levels are high. You are older than 64 years of age. You are at high risk for heart disease. What should I know about cancer screening? Many types of cancers can be detected early and may often be prevented. Depending on your health history and family history, you may need to have cancer screening at various ages. This may include screening for: Colorectal cancer. Prostate cancer. Skin cancer. Lung  cancer. What should I know about heart disease, diabetes, and high blood pressure? Blood pressure and heart disease High blood pressure causes heart disease and increases the risk of stroke. This is more likely to develop in people who have high blood pressure readings, are of African descent, or are overweight. Talk with your health care provider about your target blood pressure readings. Have your blood pressure checked: Every 3-5 years if you are 18-39 years of age. Every year if you are 40 years old or older. If you are between the ages of 65 and 75 and are a current or former smoker, ask your health care provider if you should have a one-time screening for abdominal aortic aneurysm (AAA). Diabetes Have regular diabetes screenings. This checks your fasting blood sugar level. Have the screening done: Once every three years after age 45 if you are at a normal weight and have a low risk for diabetes. More often and at a younger age if you are overweight or have a high risk for diabetes. What should I know about preventing infection? Hepatitis B If you have a higher risk for hepatitis B, you should be screened for this virus. Talk with your health care provider to find out if you are at risk forhepatitis B infection. Hepatitis C Blood testing is recommended for: Everyone born from 1945 through 1965. Anyone with known risk factors for hepatitis C. Sexually transmitted infections (STIs) You should be screened each year for STIs, including gonorrhea and chlamydia, if: You are sexually active and are younger than 64 years of age. You are older than 64 years of age   and your health care provider tells you that you are at risk for this type of infection. Your sexual activity has changed since you were last screened, and you are at increased risk for chlamydia or gonorrhea. Ask your health care provider if you are at risk. Ask your health care provider about whether you are at high risk for HIV.  Your health care provider may recommend a prescription medicine to help prevent HIV infection. If you choose to take medicine to prevent HIV, you should first get tested for HIV. You should then be tested every 3 months for as long as you are taking the medicine. Follow these instructions at home: Lifestyle Do not use any products that contain nicotine or tobacco, such as cigarettes, e-cigarettes, and chewing tobacco. If you need help quitting, ask your health care provider. Do not use street drugs. Do not share needles. Ask your health care provider for help if you need support or information about quitting drugs. Alcohol use Do not drink alcohol if your health care provider tells you not to drink. If you drink alcohol: Limit how much you have to 0-2 drinks a day. Be aware of how much alcohol is in your drink. In the [place] one drink equals one 12 oz bottle of beer (355 mL), one 5 oz glass of wine (148 mL), or one 1 oz glass of hard liquor (44 mL). General instructions Schedule regular health, dental, and eye exams. Stay current with your vaccines. Tell your health care provider if: You often feel depressed. You have ever been abused or do not feel safe at home. Summary Adopting a healthy lifestyle and getting preventive care are important in promoting health and wellness. Follow your health care provider's instructions about healthy diet, exercising, and getting tested or screened for diseases. Follow your health care provider's instructions on monitoring your cholesterol and blood pressure. This information is not intended to replace advice given to you by your health care provider. Make sure you discuss any questions you have with your healthcare provider. Document Revised: 12/05/2018 Document Reviewed: 12/05/2018 Elsevier Patient Education  2022 Elsevier Inc.  

## 2021-07-08 ENCOUNTER — Encounter: Payer: Self-pay | Admitting: Emergency Medicine

## 2021-10-19 ENCOUNTER — Other Ambulatory Visit: Payer: Self-pay | Admitting: Emergency Medicine

## 2021-11-30 ENCOUNTER — Other Ambulatory Visit: Payer: Self-pay | Admitting: Emergency Medicine

## 2022-01-17 ENCOUNTER — Other Ambulatory Visit: Payer: Self-pay | Admitting: Emergency Medicine

## 2022-03-04 ENCOUNTER — Other Ambulatory Visit: Payer: Self-pay

## 2022-03-04 ENCOUNTER — Ambulatory Visit: Payer: Self-pay | Attending: Internal Medicine

## 2022-03-04 ENCOUNTER — Other Ambulatory Visit (HOSPITAL_BASED_OUTPATIENT_CLINIC_OR_DEPARTMENT_OTHER): Payer: Self-pay

## 2022-03-04 DIAGNOSIS — Z23 Encounter for immunization: Secondary | ICD-10-CM

## 2022-03-04 MED ORDER — PFIZER COVID-19 VAC BIVALENT 30 MCG/0.3ML IM SUSP
INTRAMUSCULAR | 0 refills | Status: DC
  Filled 2022-03-04: qty 0.3, 1d supply, fill #0

## 2022-03-07 ENCOUNTER — Other Ambulatory Visit (HOSPITAL_BASED_OUTPATIENT_CLINIC_OR_DEPARTMENT_OTHER): Payer: Self-pay

## 2022-03-07 NOTE — Progress Notes (Signed)
? ?  [facility] ? ?Name:  Terris Germano    ?MRN: [id number] ?DOB: Jul 21, 1956 ? ?03/07/2022 ? ?Mr. Knotek was observed post Covid-19 immunization for 15 minutes without incident. He was provided with Vaccine Information Sheet and instruction to access the V-Safe system.  ? ?Mr. Boardley was instructed to call 911 with any severe reactions post vaccine: ?Difficulty breathing  ?Swelling of face and throat  ?A fast heartbeat  ?A bad rash all over body  ?Dizziness and weakness  ? ?Immunizations Administered   ? ? Name Date Dose VIS Date Route  ? Ambulance person Booster 03/04/2022  3:45 PM 0.3 mL 08/25/2021 Intramuscular  ? Manufacturer: [place]: [phone]  ? [place]: [phone]  ? ?  ? ? ?

## 2022-06-06 ENCOUNTER — Other Ambulatory Visit: Payer: Self-pay | Admitting: Emergency Medicine

## 2022-08-22 ENCOUNTER — Ambulatory Visit (INDEPENDENT_AMBULATORY_CARE_PROVIDER_SITE_OTHER): Payer: No Typology Code available for payment source | Admitting: Emergency Medicine

## 2022-08-22 ENCOUNTER — Encounter: Payer: Self-pay | Admitting: Emergency Medicine

## 2022-08-22 VITALS — BP 136/84 | HR 55 | Temp 98.0°F | Ht 71.0 in | Wt 194.2 lb

## 2022-08-22 DIAGNOSIS — Z0000 Encounter for general adult medical examination without abnormal findings: Secondary | ICD-10-CM

## 2022-08-22 DIAGNOSIS — Z1329 Encounter for screening for other suspected endocrine disorder: Secondary | ICD-10-CM | POA: Diagnosis not present

## 2022-08-22 DIAGNOSIS — Z130 Encounter for screening for diseases of the blood and blood-forming organs and certain disorders involving the immune mechanism: Secondary | ICD-10-CM | POA: Diagnosis not present

## 2022-08-22 DIAGNOSIS — E785 Hyperlipidemia, unspecified: Secondary | ICD-10-CM

## 2022-08-22 DIAGNOSIS — I10 Essential (primary) hypertension: Secondary | ICD-10-CM

## 2022-08-22 DIAGNOSIS — Z125 Encounter for screening for malignant neoplasm of prostate: Secondary | ICD-10-CM | POA: Diagnosis not present

## 2022-08-22 DIAGNOSIS — Z13220 Encounter for screening for lipoid disorders: Secondary | ICD-10-CM

## 2022-08-22 DIAGNOSIS — G40309 Generalized idiopathic epilepsy and epileptic syndromes, not intractable, without status epilepticus: Secondary | ICD-10-CM

## 2022-08-22 DIAGNOSIS — Z114 Encounter for screening for human immunodeficiency virus [HIV]: Secondary | ICD-10-CM

## 2022-08-22 DIAGNOSIS — Z13228 Encounter for screening for other metabolic disorders: Secondary | ICD-10-CM

## 2022-08-22 LAB — COMPREHENSIVE METABOLIC PANEL
ALT: 20 U/L (ref 0–53)
AST: 18 U/L (ref 0–37)
Albumin: 4.5 g/dL (ref 3.5–5.2)
Alkaline Phosphatase: 82 U/L (ref 39–117)
BUN: 18 mg/dL (ref 6–23)
CO2: 28 mEq/L (ref 19–32)
Calcium: 9.2 mg/dL (ref 8.4–10.5)
Chloride: 104 mEq/L (ref 96–112)
Creatinine, Ser: 0.96 mg/dL (ref 0.40–1.50)
GFR: 82.98 mL/min (ref >60.00)
Glucose, Bld: 97 mg/dL (ref 70–99)
Potassium: 4.4 mEq/L (ref 3.5–5.1)
Sodium: 141 mEq/L (ref 135–145)
Total Bilirubin: 0.4 mg/dL (ref 0.2–1.2)
Total Protein: 7.1 g/dL (ref 6.0–8.3)

## 2022-08-22 LAB — CBC WITH DIFFERENTIAL/PLATELET
Basophils Absolute: 0 10*3/uL (ref 0.0–0.1)
Basophils Relative: 0.3 % (ref 0.0–3.0)
Eosinophils Absolute: 0.1 10*3/uL (ref 0.0–0.7)
Eosinophils Relative: 1.7 % (ref 0.0–5.0)
HCT: 40.8 % (ref 39.0–52.0)
Hemoglobin: 14 g/dL (ref 13.0–17.0)
Lymphocytes Relative: 30.5 % (ref 12.0–46.0)
Lymphs Abs: 1.5 10*3/uL (ref 0.7–4.0)
MCHC: 34.3 g/dL (ref 30.0–36.0)
MCV: 91.2 fl (ref 78.0–100.0)
Monocytes Absolute: 0.5 10*3/uL (ref 0.1–1.0)
Monocytes Relative: 9.8 % (ref 3.0–12.0)
Neutro Abs: 2.8 10*3/uL (ref 1.4–7.7)
Neutrophils Relative %: 57.7 % (ref 43.0–77.0)
Platelets: 181 10*3/uL (ref 150.0–400.0)
RBC: 4.47 Mil/uL (ref 4.22–5.81)
RDW: 13 % (ref 11.5–15.5)
WBC: 4.9 10*3/uL (ref 4.0–10.5)

## 2022-08-22 LAB — LIPID PANEL
Cholesterol: 202 mg/dL — ABNORMAL HIGH (ref 0–200)
HDL: 57.5 mg/dL (ref >39.00)
LDL Cholesterol: 134 mg/dL — ABNORMAL HIGH (ref 0–99)
NonHDL: 144.75
Total CHOL/HDL Ratio: 4
Triglycerides: 56 mg/dL (ref 0.0–149.0)
VLDL: 11.2 mg/dL (ref 0.0–40.0)

## 2022-08-22 LAB — PSA: PSA: 1.59 ng/mL (ref 0.10–4.00)

## 2022-08-22 LAB — HEMOGLOBIN A1C: Hgb A1c MFr Bld: 5.9 % (ref 4.6–6.5)

## 2022-08-22 MED ORDER — AMLODIPINE BESYLATE 10 MG PO TABS: 10.0000 mg | ORAL_TABLET | Freq: Every day | ORAL | 3 refills | Status: DC

## 2022-08-22 NOTE — Patient Instructions (Signed)
Health Maintenance, Male Adopting a healthy lifestyle and getting preventive care are important in promoting health and wellness. Ask your health care provider about: The right schedule for you to have regular tests and exams. Things you can do on your own to prevent diseases and keep yourself healthy. What should I know about diet, weight, and exercise? Eat a healthy diet  Eat a diet that includes plenty of vegetables, fruits, low-fat dairy products, and lean protein. Do not eat a lot of foods that are high in solid fats, added sugars, or sodium. Maintain a healthy weight Body mass index (BMI) is a measurement that can be used to identify possible weight problems. It estimates body fat based on height and weight. Your health care provider can help determine your BMI and help you achieve or maintain a healthy weight. Get regular exercise Get regular exercise. This is one of the most important things you can do for your health. Most adults should: Exercise for at least 150 minutes each week. The exercise should increase your heart rate and make you sweat (moderate-intensity exercise). Do strengthening exercises at least twice a week. This is in addition to the moderate-intensity exercise. Spend less time sitting. Even light physical activity can be beneficial. Watch cholesterol and blood lipids Have your blood tested for lipids and cholesterol at 65 years of age, then have this test every 5 years. You may need to have your cholesterol levels checked more often if: Your lipid or cholesterol levels are high. You are older than 65 years of age. You are at high risk for heart disease. What should I know about cancer screening? Many types of cancers can be detected early and may often be prevented. Depending on your health history and family history, you may need to have cancer screening at various ages. This may include screening for: Colorectal cancer. Prostate cancer. Skin cancer. Lung  cancer. What should I know about heart disease, diabetes, and high blood pressure? Blood pressure and heart disease High blood pressure causes heart disease and increases the risk of stroke. This is more likely to develop in people who have high blood pressure readings or are overweight. Talk with your health care provider about your target blood pressure readings. Have your blood pressure checked: Every 3-5 years if you are 18-39 years of age. Every year if you are 40 years old or older. If you are between the ages of 65 and 75 and are a current or former smoker, ask your health care provider if you should have a one-time screening for abdominal aortic aneurysm (AAA). Diabetes Have regular diabetes screenings. This checks your fasting blood sugar level. Have the screening done: Once every three years after age 45 if you are at a normal weight and have a low risk for diabetes. More often and at a younger age if you are overweight or have a high risk for diabetes. What should I know about preventing infection? Hepatitis B If you have a higher risk for hepatitis B, you should be screened for this virus. Talk with your health care provider to find out if you are at risk for hepatitis B infection. Hepatitis C Blood testing is recommended for: Everyone born from 1945 through 1965. Anyone with known risk factors for hepatitis C. Sexually transmitted infections (STIs) You should be screened each year for STIs, including gonorrhea and chlamydia, if: You are sexually active and are younger than 65 years of age. You are older than 65 years of age and your   health care provider tells you that you are at risk for this type of infection. Your sexual activity has changed since you were last screened, and you are at increased risk for chlamydia or gonorrhea. Ask your health care provider if you are at risk. Ask your health care provider about whether you are at high risk for HIV. Your health care provider  may recommend a prescription medicine to help prevent HIV infection. If you choose to take medicine to prevent HIV, you should first get tested for HIV. You should then be tested every 3 months for as long as you are taking the medicine. Follow these instructions at home: Alcohol use Do not drink alcohol if your health care provider tells you not to drink. If you drink alcohol: Limit how much you have to 0-2 drinks a day. Know how much alcohol is in your drink. In the [place] one drink equals one 12 oz bottle of beer (355 mL), one 5 oz glass of wine (148 mL), or one 1 oz glass of hard liquor (44 mL). Lifestyle Do not use any products that contain nicotine or tobacco. These products include cigarettes, chewing tobacco, and vaping devices, such as e-cigarettes. If you need help quitting, ask your health care provider. Do not use street drugs. Do not share needles. Ask your health care provider for help if you need support or information about quitting drugs. General instructions Schedule regular health, dental, and eye exams. Stay current with your vaccines. Tell your health care provider if: You often feel depressed. You have ever been abused or do not feel safe at home. Summary Adopting a healthy lifestyle and getting preventive care are important in promoting health and wellness. Follow your health care provider's instructions about healthy diet, exercising, and getting tested or screened for diseases. Follow your health care provider's instructions on monitoring your cholesterol and blood pressure. This information is not intended to replace advice given to you by your health care provider. Make sure you discuss any questions you have with your health care provider. Document Revised: 05/03/2021 Document Reviewed: 05/03/2021 Elsevier Patient Education  2023 Elsevier Inc.  

## 2022-08-22 NOTE — Progress Notes (Signed)
Shane Phillips 66 y.o.   Chief Complaint  Patient presents with   Annual Exam    No concerns     HISTORY OF PRESENT ILLNESS: This is a 66 y.o. male A1A here for annual exam. Doing well.  Has no complaints or medical concerns today. Has history of hypertension and dyslipidemia Also has history of convulsive disorder since age 37.  On Tegretol 200 mg daily at bedtime.  Sees neurologist on a regular basis.  Last seizure in 2000.  HPI   Prior to Admission medications   Medication Sig Start Date End Date Taking? Authorizing Provider  amLODipine (NORVASC) 10 MG tablet TAKE ONE TABLET BY MOUTH DAILY 06/06/22  Yes Thurmon Mizell, [name], MD  carbamazepine (TEGRETOL) 200 MG tablet TAKE TWO TABLETS BY MOUTH EVERY NIGHT AT BEDTIME 11/30/21  Yes Bradyn Vassey, [name], MD  co-enzyme Q-10 30 MG capsule Take 30 mg by mouth daily.   Yes [provider]  GARLIC PO Take by mouth daily.   Yes [provider]  GLUCOSAMINE-CHONDROITIN PO Take by mouth daily.   Yes [provider]  Omega-3 Fatty Acids (FISH OIL PO) Take by mouth daily.   Yes [provider]  rosuvastatin (CRESTOR) 20 MG tablet TAKE ONE TABLET BY MOUTH DAILY 01/18/22  Yes Horald Pollen, MD    No Known Allergies  Patient Active Problem List   Diagnosis Date Noted   Dyslipidemia 11/19/2019   Hyperlipidemia 08/19/2019   Essential hypertension 09/05/2017   Localization-related focal epilepsy with simple partial seizures ([place]) 08/05/2013   Generalized convulsive epilepsy ([place]) 08/05/2013    Past Medical History:  Diagnosis Date   Arthritis    hands    High cholesterol    Hypertension    Seizure ([place])    last sz 2001    Past Surgical History:  Procedure Laterality Date   COLONOSCOPY  10/15/2007   None      Social History   Socioeconomic History   Marital status: Married    Spouse name: [name]   Number of children: Not on file   Years of education: Not on file   Highest  education level: Not on file  Occupational History   Occupation: Self Emp[employer]  Tobacco Use   Smoking status: Never   Smokeless tobacco: Never  Substance and Sexual Activity   Alcohol use: No   Drug use: Yes    Types: "Crack" cocaine   Sexual activity: Yes  Other Topics Concern   Not on file  Social History Narrative   Patient owns a [employer].   Patient is Married to [place].       Social Determinants of Health   Financial Resource Strain: Not on file  Food Insecurity: Not on file  Transportation Needs: Not on file  Physical Activity: Not on file  Stress: Not on file  Social Connections: Not on file  Intimate Partner Violence: Not on file    Family History  Problem Relation Age of Onset   Leukemia Mother    Cancer Mother    Heart attack Brother        2 in 61 months    Hyperlipidemia Brother    Cancer Sister        breast cancer   Hyperlipidemia Sister    Breast cancer Sister    Cancer Father        bladder cancer   Cancer Maternal Grandfather        lung cancer   Cancer Paternal  Grandfather        melanoma   Hyperlipidemia Sister    Colon cancer Neg Hx    Colon polyps Neg Hx    Esophageal cancer Neg Hx    Rectal cancer Neg Hx    Stomach cancer Neg Hx      Review of Systems  Constitutional: Negative.  Negative for chills and fever.  HENT: Negative.  Negative for congestion and sore throat.   Respiratory: Negative.  Negative for cough and shortness of breath.   Cardiovascular: Negative.  Negative for chest pain and palpitations.  Gastrointestinal: Negative.  Negative for abdominal pain, diarrhea, nausea and vomiting.  Genitourinary: Negative.  Negative for dysuria.  Skin: Negative.  Negative for rash.  Neurological:  Negative for dizziness and headaches.  All other systems reviewed and are negative.  Today's Vitals   08/22/22 0918  BP: 136/84  Pulse: (!) 55  Temp: 98 F (36.7 C)  TempSrc: Oral  SpO2: 96%  Weight: 194 lb 4 oz  (88.1 kg)  Height: '5\' 11"'$  (1.803 m)   Body mass index is 27.09 kg/m.   Physical Exam Vitals reviewed.  Constitutional:      Appearance: Normal appearance.  HENT:     Head: Normocephalic.     Right Ear: Tympanic membrane, ear canal and external ear normal.     Left Ear: Tympanic membrane, ear canal and external ear normal.     Mouth/Throat:     Mouth: Mucous membranes are moist.     Pharynx: Oropharynx is clear.  Eyes:     Extraocular Movements: Extraocular movements intact.     Conjunctiva/sclera: Conjunctivae normal.     Pupils: Pupils are equal, round, and reactive to light.  Cardiovascular:     Rate and Rhythm: Normal rate and regular rhythm.     Pulses: Normal pulses.     Heart sounds: Normal heart sounds.  Pulmonary:     Effort: Pulmonary effort is normal.     Breath sounds: Normal breath sounds.  Abdominal:     General: There is no distension.     Palpations: Abdomen is soft. There is no mass.     Tenderness: There is no abdominal tenderness.  Musculoskeletal:     Cervical back: No tenderness.     Right lower leg: No edema.     Left lower leg: No edema.  Lymphadenopathy:     Cervical: No cervical adenopathy.  Skin:    General: Skin is warm and dry.     Capillary Refill: Capillary refill takes less than 2 seconds.  Neurological:     General: No focal deficit present.     Mental Status: He is alert and oriented to person, place, and time.  Psychiatric:        Mood and Affect: Mood normal.        Behavior: Behavior normal.      ASSESSMENT & PLAN: Problem List Items Addressed This Visit       Cardiovascular and Mediastinum   Essential hypertension   Relevant Medications   amLODipine (NORVASC) 10 MG tablet   Other Relevant Orders   Comprehensive metabolic panel     Nervous and Auditory   Generalized convulsive epilepsy ([place])     Other   Dyslipidemia   Relevant Orders   Lipid panel   Other Visit Diagnoses     Routine general medical examination  at a health care facility    -  Primary   Prostate cancer screening  Relevant Orders   PSA(Must document that pt has been informed of limitations of PSA testing.)   Screening for HIV (human immunodeficiency virus)       Relevant Orders   HIV antibody   Screening for deficiency anemia       Relevant Orders   CBC with Differential   Screening for lipoid disorders       Relevant Orders   Lipid panel   Screening for endocrine, metabolic and immunity disorder       Relevant Orders   Hemoglobin A1c      Modifiable risk factors discussed with patient. Anticipatory guidance according to age provided. The following topics were also discussed: Social Determinants of Health Smoking.  Non-smoker Diet and nutrition.  Good nutritional habits Benefits of exercise.  Stays physically active Cancer screening and review of most recent colonoscopy report from 2018 Vaccinations recommendations Cardiovascular risk assessment The 10-year ASCVD risk score (Arnett DK, et al., 2019) is: 14.6%   Values used to calculate the score:     Age: 6 years     Sex: Male     Is Non-Hispanic African American: No     Diabetic: No     Tobacco smoker: No     Systolic Blood Pressure: 295 mmHg     Is BP treated: Yes     HDL Cholesterol: 60.3 mg/dL     Total Cholesterol: 197 mg/dL Review of chronic medical problems and their management Review of all medications Mental health including depression and anxiety Fall and accident prevention  Patient Instructions  Health Maintenance, Male Adopting a healthy lifestyle and getting preventive care are important in promoting health and wellness. Ask your health care provider about: The right schedule for you to have regular tests and exams. Things you can do on your own to prevent diseases and keep yourself healthy. What should I know about diet, weight, and exercise? Eat a healthy diet  Eat a diet that includes plenty of vegetables, fruits, low-fat dairy  products, and lean protein. Do not eat a lot of foods that are high in solid fats, added sugars, or sodium. Maintain a healthy weight Body mass index (BMI) is a measurement that can be used to identify possible weight problems. It estimates body fat based on height and weight. Your health care provider can help determine your BMI and help you achieve or maintain a healthy weight. Get regular exercise Get regular exercise. This is one of the most important things you can do for your health. Most adults should: Exercise for at least 150 minutes each week. The exercise should increase your heart rate and make you sweat (moderate-intensity exercise). Do strengthening exercises at least twice a week. This is in addition to the moderate-intensity exercise. Spend less time sitting. Even light physical activity can be beneficial. Watch cholesterol and blood lipids Have your blood tested for lipids and cholesterol at 65 years of age, then have this test every 5 years. You may need to have your cholesterol levels checked more often if: Your lipid or cholesterol levels are high. You are older than 65 years of age. You are at high risk for heart disease. What should I know about cancer screening? Many types of cancers can be detected early and may often be prevented. Depending on your health history and family history, you may need to have cancer screening at various ages. This may include screening for: Colorectal cancer. Prostate cancer. Skin cancer. Lung cancer. What should I know about heart disease,  diabetes, and high blood pressure? Blood pressure and heart disease High blood pressure causes heart disease and increases the risk of stroke. This is more likely to develop in people who have high blood pressure readings or are overweight. Talk with your health care provider about your target blood pressure readings. Have your blood pressure checked: Every 3-5 years if you are 18-39 years of  age. Every year if you are 63 years old or older. If you are between the ages of 32 and 1 and are a current or former smoker, ask your health care provider if you should have a one-time screening for abdominal aortic aneurysm (AAA). Diabetes Have regular diabetes screenings. This checks your fasting blood sugar level. Have the screening done: Once every three years after age 19 if you are at a normal weight and have a low risk for diabetes. More often and at a younger age if you are overweight or have a high risk for diabetes. What should I know about preventing infection? Hepatitis B If you have a higher risk for hepatitis B, you should be screened for this virus. Talk with your health care provider to find out if you are at risk for hepatitis B infection. Hepatitis C Blood testing is recommended for: Everyone born from [birth year] through 1964. Anyone with known risk factors for hepatitis C. Sexually transmitted infections (STIs) You should be screened each year for STIs, including gonorrhea and chlamydia, if: You are sexually active and are younger than 65 years of age. You are older than 65 years of age and your health care provider tells you that you are at risk for this type of infection. Your sexual activity has changed since you were last screened, and you are at increased risk for chlamydia or gonorrhea. Ask your health care provider if you are at risk. Ask your health care provider about whether you are at high risk for HIV. Your health care provider may recommend a prescription medicine to help prevent HIV infection. If you choose to take medicine to prevent HIV, you should first get tested for HIV. You should then be tested every 3 months for as long as you are taking the medicine. Follow these instructions at home: Alcohol use Do not drink alcohol if your health care provider tells you not to drink. If you drink alcohol: Limit how much you have to 0-2 drinks a day. Know how much  alcohol is in your drink. In the [place] one drink equals one 12 oz bottle of beer (355 mL), one 5 oz glass of wine (148 mL), or one 1 oz glass of hard liquor (44 mL). Lifestyle Do not use any products that contain nicotine or tobacco. These products include cigarettes, chewing tobacco, and vaping devices, such as e-cigarettes. If you need help quitting, ask your health care provider. Do not use street drugs. Do not share needles. Ask your health care provider for help if you need support or information about quitting drugs. General instructions Schedule regular health, dental, and eye exams. Stay current with your vaccines. Tell your health care provider if: You often feel depressed. You have ever been abused or do not feel safe at home. Summary Adopting a healthy lifestyle and getting preventive care are important in promoting health and wellness. Follow your health care provider's instructions about healthy diet, exercising, and getting tested or screened for diseases. Follow your health care provider's instructions on monitoring your cholesterol and blood pressure. This information is not intended to replace advice given  to you by your health care provider. Make sure you discuss any questions you have with your health care provider. Document Revised: 05/03/2021 Document Reviewed: 05/03/2021 Elsevier Patient Education  [place], MD [facility]

## 2022-08-23 LAB — HIV ANTIBODY (ROUTINE TESTING W REFLEX): HIV 1&2 Ab, 4th Generation: NONREACTIVE

## 2022-09-27 DIAGNOSIS — Z008 Encounter for other general examination: Secondary | ICD-10-CM | POA: Diagnosis not present

## 2022-09-27 DIAGNOSIS — N182 Chronic kidney disease, stage 2 (mild): Secondary | ICD-10-CM | POA: Diagnosis not present

## 2022-09-27 DIAGNOSIS — R7303 Prediabetes: Secondary | ICD-10-CM | POA: Diagnosis not present

## 2022-09-27 DIAGNOSIS — G40309 Generalized idiopathic epilepsy and epileptic syndromes, not intractable, without status epilepticus: Secondary | ICD-10-CM | POA: Diagnosis not present

## 2022-09-27 DIAGNOSIS — R2681 Unsteadiness on feet: Secondary | ICD-10-CM | POA: Diagnosis not present

## 2022-09-27 DIAGNOSIS — E785 Hyperlipidemia, unspecified: Secondary | ICD-10-CM | POA: Diagnosis not present

## 2022-09-27 DIAGNOSIS — I129 Hypertensive chronic kidney disease with stage 1 through stage 4 chronic kidney disease, or unspecified chronic kidney disease: Secondary | ICD-10-CM | POA: Diagnosis not present

## 2022-09-27 DIAGNOSIS — D692 Other nonthrombocytopenic purpura: Secondary | ICD-10-CM | POA: Diagnosis not present

## 2022-11-27 ENCOUNTER — Other Ambulatory Visit: Payer: Self-pay | Admitting: Emergency Medicine

## 2023-01-10 ENCOUNTER — Other Ambulatory Visit: Payer: Self-pay | Admitting: Emergency Medicine

## 2023-05-31 ENCOUNTER — Ambulatory Visit (HOSPITAL_COMMUNITY)
Admission: EM | Admit: 2023-05-31 | Discharge: 2023-05-31 | Disposition: A | Discharge status: Home / Self Care | Payer: No Typology Code available for payment source | Attending: Emergency Medicine | Admitting: Emergency Medicine

## 2023-05-31 ENCOUNTER — Encounter (HOSPITAL_COMMUNITY): Payer: Self-pay | Admitting: *Deleted

## 2023-05-31 DIAGNOSIS — Z20822 Contact with and (suspected) exposure to covid-19: Secondary | ICD-10-CM

## 2023-05-31 DIAGNOSIS — J029 Acute pharyngitis, unspecified: Secondary | ICD-10-CM | POA: Diagnosis not present

## 2023-05-31 DIAGNOSIS — I10 Essential (primary) hypertension: Secondary | ICD-10-CM | POA: Diagnosis not present

## 2023-05-31 LAB — POCT RAPID STREP A (OFFICE): Rapid Strep A Screen: NEGATIVE

## 2023-05-31 NOTE — Discharge Instructions (Signed)
I will contact you if and only if your strep comes back positive.  If your your rapid strep was negative today, I will send off a throat culture.  We will contact you and call in the appropriate antibiotics if your culture comes back positive for an infection requiring antibiotic treatment.  Give [place] a working phone number.   1 gram of Tylenol and 600 mg ibuprofen together 3 times a day as needed for pain.  Make sure you drink plenty of extra fluids.  Some people find salt water gargles and  Traditional Medicinal's "Throat Coat" tea helpful. Take 5 mL of liquid Benadryl and 5 mL of Maalox. Mix it together, and then hold it in your mouth for as long as you can and then swallow. You may do this 4 times a day.  Honey and lemon dissolved in hot water can also be soothing.  Keep a close eye on your blood pressure. It is important to keep your blood pressure under good control, as having a elevated blood pressure for prolonged periods of time significantly increases your risk of stroke, heart attacks, kidney damage, eye damage, and other problems. Get a validated blood pressure cuff that goes on your arm, not your wrist.  Measure your blood pressure once a day, preferably at the same time every day. Keep a log of this and bring it to your next doctor's appointment.  Bring your blood pressure cuff as well.   Return immediately to the ER if you start having chest pain, headache, problems seeing, problems talking, problems walking, if you feel like you're about to pass out, if you do pass out, if you have a seizure, or for any other concerns.   Go to www.goodrx.com  or www.costplusdrugs.com to look up your medications. This will give you a list of where you can find your prescriptions at the most affordable prices. Or ask the pharmacist what the cash price is, or if they have any other discount programs available to help make your medication more affordable. This can be less expensive than what you would pay with  insurance.

## 2023-05-31 NOTE — ED Provider Notes (Signed)
HPI  SUBJECTIVE:  Patient reports sore throat starting ***. Sx worse with swallowing.  Sx better with ***. Has been taking *** w/ o relief.  + Fever tmax *** + Swollen neck glands   No neck stiffness  No Cough, wheezing No nasal congestion, rhinorrhea, postnasal drip No Myalgias No Headache No Rash  No loss of taste or smell No shortness of breath or difficulty breathing No nausea, vomiting No diarrhea No abdominal pain     No Recent Strep, mono, flu, COVID exposure No reflux sxs No Allergy sxs  No Breathing difficulty, voice changes, sensation of throat swelling shut No Drooling No Trismus No abx in past month. All immunizations UTD.  No antipyretic in past 6 hrs    Past Medical History:  Diagnosis Date   Arthritis    hands    High cholesterol    Hypertension    Seizure ([facility])    last sz 2001    Past Surgical History:  Procedure Laterality Date   COLONOSCOPY  10/15/2007   None      Family History  Problem Relation Age of Onset   Leukemia Mother    Cancer Mother    Heart attack Brother        2 in 6 months    Hyperlipidemia Brother    Cancer Sister        breast cancer   Hyperlipidemia Sister    Breast cancer Sister    Cancer Father        bladder cancer   Cancer Maternal Grandfather        lung cancer   Cancer Paternal Grandfather        melanoma   Hyperlipidemia Sister    Colon cancer Neg Hx    Colon polyps Neg Hx    Esophageal cancer Neg Hx    Rectal cancer Neg Hx    Stomach cancer Neg Hx     Social History   Tobacco Use   Smoking status: Never   Smokeless tobacco: Never  Substance Use Topics   Alcohol use: No   Drug use: Yes    Types: "Crack" cocaine    No current facility-administered medications for this encounter.  Current Outpatient Medications:    amLODipine (NORVASC) 10 MG tablet, Take 1 tablet (10 mg total) by mouth daily., Disp: 90 tablet, Rfl: 3   carbamazepine (TEGRETOL) 200 MG tablet, TAKE TWO TABLETS BY MOUTH  EVERY NIGHT AT BEDTIME, Disp: 180 tablet, Rfl: 3   co-enzyme Q-10 30 MG capsule, Take 30 mg by mouth daily., Disp: , Rfl:    GARLIC PO, Take by mouth daily., Disp: , Rfl:    GLUCOSAMINE-CHONDROITIN PO, Take by mouth daily., Disp: , Rfl:    Omega-3 Fatty Acids (FISH OIL PO), Take by mouth daily., Disp: , Rfl:    rosuvastatin (CRESTOR) 20 MG tablet, TAKE ONE TABLET BY MOUTH DAILY, Disp: 90 tablet, Rfl: 3  No Known Allergies   ROS  As noted in HPI.   Physical Exam  BP (!) 175/89 (BP Location: Right Arm)   Pulse 67   Temp 98.5 F (36.9 C) (Oral)   Resp 18   SpO2 95%   Repeat blood pressure 166/93  Constitutional: Well developed, well nourished, no acute distress Eyes:  EOMI, conjunctiva normal bilaterally HENT: Normocephalic, atraumatic,mucus membranes moist.  No nasal congestion.  Erythematous oropharynx.  Tonsils normal size without exudates.   Uvula midline.  No obvious postnasal drip. Respiratory: Normal inspiratory effort Cardiovascular: Normal rate, no  murmurs, rubs, gallops GI: nondistended, nontender. No appreciable splenomegaly skin: No rash, skin intact Lymph: No appreciable anterior cervical LN.  No posterior cervical lymphadenopathy Musculoskeletal: no deformities Neurologic: Alert & oriented x 3, no focal neuro deficits Psychiatric: Speech and behavior appropriate.   ED Course   Medications - No data to display  Orders Placed This Encounter  Procedures   SARS CORONAVIRUS 2 (TAT 6-24 HRS) Anterior Nasal Swab    Standing Status:   Standing    Number of Occurrences:   1   Culture, group A strep (throat)    Standing Status:   Standing    Number of Occurrences:   1   POC rapid strep A    Standing Status:   Standing    Number of Occurrences:   1    Results for orders placed or performed during the hospital encounter of 05/31/23 (from the past 24 hour(s))  POC rapid strep A     Status: None   Collection Time: 05/31/23  6:15 PM  Result Value Ref Range    Rapid Strep A Screen Negative Negative   No results found.  ED Clinical Impression  1. Pharyngitis, unspecified etiology   2. Encounter for laboratory testing for COVID-19 virus   3. Elevated blood pressure reading with diagnosis of hypertension      ED Assessment/Plan  {The patient has not been seen in Urgent Care in the last 3 years. :1}  1.  Pharyngitis.  Will check COVID, strep.  Will contact patient at [phone] if either 1 of these are positive.  Penicillin if strep is positive.  Supportive treatment if COVID is positive.  Patient does not want antivirals.  In the meantime, ibuprofen, Tylenol, Benadryl/Maalox mixture. Patient to followup with PCP when necessary.   Strep negative.  Sending off throat culture.  2.  Elevated blood pressure reading with diagnosis of hypertension.  Pete blood pressure measurement on my exam 166/93.  Patient states that he has whitecoat syndrome and that his blood pressure normally runs in the 1 7 teens over 80s.  States that he is compliant with his medications.  Will have him monitor this at home.  ER return precautions given.  Discussed labs,  MDM, plan and followup with patient. Discussed sn/sx that should prompt return to the ED. patient agrees with plan.   No orders of the defined types were placed in this encounter.    *This clinic note was created using Dragon dictation software. Therefore, there may be occasional mistakes despite careful proofreading.

## 2023-05-31 NOTE — ED Triage Notes (Signed)
Pt states he has left ear, neck pain and sore throat he is having more pain with swallowing x a couple of days. He hasn't taken any meds but thought about gargling with warm water and salt.

## 2023-06-01 LAB — SARS CORONAVIRUS 2 (TAT 6-24 HRS): SARS Coronavirus 2: NEGATIVE

## 2023-06-03 LAB — CULTURE, GROUP A STREP (THRC)

## 2023-08-10 ENCOUNTER — Encounter (INDEPENDENT_AMBULATORY_CARE_PROVIDER_SITE_OTHER): Payer: Self-pay

## 2023-09-04 ENCOUNTER — Encounter: Payer: Self-pay | Admitting: Emergency Medicine

## 2023-09-04 ENCOUNTER — Ambulatory Visit (INDEPENDENT_AMBULATORY_CARE_PROVIDER_SITE_OTHER): Payer: No Typology Code available for payment source | Admitting: Emergency Medicine

## 2023-09-04 VITALS — BP 138/86 | HR 89 | Temp 98.2°F | Ht 71.0 in | Wt 187.2 lb

## 2023-09-04 DIAGNOSIS — G40309 Generalized idiopathic epilepsy and epileptic syndromes, not intractable, without status epilepticus: Secondary | ICD-10-CM | POA: Diagnosis not present

## 2023-09-04 DIAGNOSIS — Z0001 Encounter for general adult medical examination with abnormal findings: Secondary | ICD-10-CM

## 2023-09-04 DIAGNOSIS — E785 Hyperlipidemia, unspecified: Secondary | ICD-10-CM

## 2023-09-04 DIAGNOSIS — Z13228 Encounter for screening for other metabolic disorders: Secondary | ICD-10-CM

## 2023-09-04 DIAGNOSIS — Z1329 Encounter for screening for other suspected endocrine disorder: Secondary | ICD-10-CM

## 2023-09-04 DIAGNOSIS — Z0000 Encounter for general adult medical examination without abnormal findings: Secondary | ICD-10-CM

## 2023-09-04 DIAGNOSIS — I10 Essential (primary) hypertension: Secondary | ICD-10-CM | POA: Diagnosis not present

## 2023-09-04 DIAGNOSIS — Z125 Encounter for screening for malignant neoplasm of prostate: Secondary | ICD-10-CM | POA: Diagnosis not present

## 2023-09-04 DIAGNOSIS — Z130 Encounter for screening for diseases of the blood and blood-forming organs and certain disorders involving the immune mechanism: Secondary | ICD-10-CM

## 2023-09-04 LAB — COMPREHENSIVE METABOLIC PANEL
ALT: 15 U/L (ref 0–53)
AST: 15 U/L (ref 0–37)
Albumin: 4.2 g/dL (ref 3.5–5.2)
Alkaline Phosphatase: 77 U/L (ref 39–117)
BUN: 17 mg/dL (ref 6–23)
CO2: 29 meq/L (ref 19–32)
Calcium: 8.9 mg/dL (ref 8.4–10.5)
Chloride: 103 meq/L (ref 96–112)
Creatinine, Ser: 1.12 mg/dL (ref 0.40–1.50)
GFR: 68.47 mL/min (ref >60.00)
Glucose, Bld: 93 mg/dL (ref 70–99)
Potassium: 3.8 meq/L (ref 3.5–5.1)
Sodium: 142 meq/L (ref 135–145)
Total Bilirubin: 0.3 mg/dL (ref 0.2–1.2)
Total Protein: 7 g/dL (ref 6.0–8.3)

## 2023-09-04 LAB — CBC WITH DIFFERENTIAL/PLATELET
Basophils Absolute: 0 10*3/uL (ref 0.0–0.1)
Basophils Relative: 0.3 % (ref 0.0–3.0)
Eosinophils Absolute: 0.1 10*3/uL (ref 0.0–0.7)
Eosinophils Relative: 1.8 % (ref 0.0–5.0)
HCT: 42.4 % (ref 39.0–52.0)
Hemoglobin: 14 g/dL (ref 13.0–17.0)
Lymphocytes Relative: 28.8 % (ref 12.0–46.0)
Lymphs Abs: 1.5 10*3/uL (ref 0.7–4.0)
MCHC: 33 g/dL (ref 30.0–36.0)
MCV: 91.7 fl (ref 78.0–100.0)
Monocytes Absolute: 0.5 10*3/uL (ref 0.1–1.0)
Monocytes Relative: 10.3 % (ref 3.0–12.0)
Neutro Abs: 3.1 10*3/uL (ref 1.4–7.7)
Neutrophils Relative %: 58.8 % (ref 43.0–77.0)
Platelets: 215 10*3/uL (ref 150.0–400.0)
RBC: 4.62 Mil/uL (ref 4.22–5.81)
RDW: 12.8 % (ref 11.5–15.5)
WBC: 5.3 10*3/uL (ref 4.0–10.5)

## 2023-09-04 LAB — LIPID PANEL
Cholesterol: 198 mg/dL (ref 0–200)
HDL: 63.8 mg/dL (ref >39.00)
LDL Cholesterol: 124 mg/dL — ABNORMAL HIGH (ref 0–99)
NonHDL: 133.97
Total CHOL/HDL Ratio: 3
Triglycerides: 50 mg/dL (ref 0.0–149.0)
VLDL: 10 mg/dL (ref 0.0–40.0)

## 2023-09-04 LAB — PSA: PSA: 2.1 ng/mL (ref 0.10–4.00)

## 2023-09-04 LAB — HEMOGLOBIN A1C: Hgb A1c MFr Bld: 5.8 % (ref 4.6–6.5)

## 2023-09-04 MED ORDER — AMLODIPINE BESYLATE 10 MG PO TABS: 10.0000 mg | ORAL_TABLET | Freq: Every day | ORAL | 3 refills | Status: DC

## 2023-09-04 NOTE — Assessment & Plan Note (Signed)
Stable chronic condition Diet and nutrition discussed Lipid profile done today Continue rosuvastatin 20 mg daily. The 10-year ASCVD risk score (Arnett DK, et al., 2019) is: 16.8%   Values used to calculate the score:     Age: 67 years     Sex: Male     Is Non-Hispanic African American: No     Diabetic: No     Tobacco smoker: No     Systolic Blood Pressure: 138 mmHg     Is BP treated: Yes     HDL Cholesterol: 57.5 mg/dL     Total Cholesterol: 202 mg/dL

## 2023-09-04 NOTE — Assessment & Plan Note (Signed)
Elevated blood pressure readings in the office but normal at home Cardiovascular risks associated with hypertension discussed Dietary approaches to stop hypertension discussed Recommend to continue amlodipine 10 mg daily

## 2023-09-04 NOTE — Progress Notes (Signed)
Shane Phillips 67 y.o.   Chief Complaint  Patient presents with   Annual Exam    No concerns     HISTORY OF PRESENT ILLNESS: This is a 67 y.o. male here for annual exam and follow-up of chronic medical conditions including hypertension and dyslipidemia Overall doing well. Has no complaints or medical concerns today Up-to-date with colonoscopy Eating better.  Non-smoker. Blood pressure readings normal at home BP Readings from Last 3 Encounters:  05/31/23 (!) 175/89  08/22/22 136/84  06/16/21 138/80     HPI   Prior to Admission medications   Medication Sig Start Date End Date Taking? Authorizing Provider  amLODipine (NORVASC) 10 MG tablet Take 1 tablet (10 mg total) by mouth daily. 08/22/22  Yes Arvel Oquinn, Eilleen Kempf, MD  carbamazepine (TEGRETOL) 200 MG tablet TAKE TWO TABLETS BY MOUTH EVERY NIGHT AT BEDTIME 11/27/22  Yes Dannetta Lekas, Eilleen Kempf, MD  co-enzyme Q-10 30 MG capsule Take 30 mg by mouth daily.   Yes [provider]  GLUCOSAMINE-CHONDROITIN PO Take by mouth daily.   Yes [provider]  Omega-3 Fatty Acids (FISH OIL PO) Take by mouth daily.   Yes [provider]  rosuvastatin (CRESTOR) 20 MG tablet TAKE ONE TABLET BY MOUTH DAILY 01/10/23  Yes Georgina Quint, MD    No Known Allergies  Patient Active Problem List   Diagnosis Date Noted   Dyslipidemia 11/19/2019   Hyperlipidemia 08/19/2019   Essential hypertension 09/05/2017   Localization-related focal epilepsy with simple partial seizures (HCC) 08/05/2013   Generalized convulsive epilepsy (HCC) 08/05/2013    Past Medical History:  Diagnosis Date   Arthritis    hands    High cholesterol    Hypertension    Seizure (HCC)    last sz 2001    Past Surgical History:  Procedure Laterality Date   COLONOSCOPY  10/15/2007   None      Social History   Socioeconomic History   Marital status: Married    Spouse name: Shane Phillips   Number of children: Not on file   Years of  education: Not on file   Highest education level: Not on file  Occupational History   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   Patient is Married to Shane Phillips.       Social Determinants of Health   Financial Resource Strain: Not on file  Food Insecurity: Not on file  Transportation Needs: Not on file  Physical Activity: Not on file  Stress: Not on file  Social Connections: Not on file  Intimate Partner Violence: Not on file    Family History  Problem Relation Age of Onset   Leukemia Mother    Cancer Mother    Heart attack Brother        2 in 6 months    Hyperlipidemia Brother    Cancer Sister        breast cancer   Hyperlipidemia Sister    Breast cancer Sister    Cancer Father        bladder cancer   Cancer Maternal Grandfather        lung cancer   Cancer Paternal Grandfather  melanoma   Hyperlipidemia Sister    Colon cancer Neg Hx    Colon polyps Neg Hx    Esophageal cancer Neg Hx    Rectal cancer Neg Hx    Stomach cancer Neg Hx      Review of Systems  Constitutional: Negative.  Negative for chills and fever.  HENT: Negative.  Negative for congestion and sore throat.   Respiratory: Negative.  Negative for cough and shortness of breath.   Cardiovascular: Negative.  Negative for chest pain and palpitations.  Gastrointestinal:  Negative for abdominal pain, diarrhea, nausea and vomiting.  Genitourinary: Negative.  Negative for dysuria and hematuria.  Skin: Negative.  Negative for rash.  Neurological: Negative.  Negative for dizziness and headaches.  All other systems reviewed and are negative.   Today's Vitals   09/04/23 0825  BP: 138/86  Pulse: 89  Temp: 98.2 F (36.8 C)   TempSrc: Oral  SpO2: 98%  Weight: 187 lb 4 oz (84.9 kg)  Height: 5\' 11"  (1.803 m)   Body mass index is 26.12 kg/m.   Physical Exam Vitals reviewed.  Constitutional:      Appearance: Normal appearance.  HENT:     Head: Normocephalic.     Right Ear: Tympanic membrane, ear canal and external ear normal.     Left Ear: Tympanic membrane, ear canal and external ear normal.     Mouth/Throat:     Mouth: Mucous membranes are moist.     Pharynx: Oropharynx is clear.  Eyes:     Extraocular Movements: Extraocular movements intact.     Conjunctiva/sclera: Conjunctivae normal.     Pupils: Pupils are equal, round, and reactive to light.  Cardiovascular:     Rate and Rhythm: Normal rate and regular rhythm.     Pulses: Normal pulses.     Heart sounds: Normal heart sounds.  Pulmonary:     Effort: Pulmonary effort is normal.     Breath sounds: Normal breath sounds.  Abdominal:     Palpations: Abdomen is soft.     Tenderness: There is no abdominal tenderness.  Musculoskeletal:     Cervical back: No tenderness.     Right lower leg: No edema.     Left lower leg: No edema.  Lymphadenopathy:     Cervical: No cervical adenopathy.  Skin:    General: Skin is warm and dry.  Neurological:     General: No focal deficit present.     Mental Status: He is alert and oriented to person, place, and time.  Psychiatric:        Mood and Affect: Mood normal.        Behavior: Behavior normal.      ASSESSMENT & PLAN: Problem List Items Addressed This Visit       Cardiovascular and Mediastinum   Essential hypertension    Elevated blood pressure readings in the office but normal at home Cardiovascular risks associated with hypertension discussed Dietary approaches to stop hypertension discussed Recommend to continue amlodipine 10 mg daily      Relevant Medications   amLODipine (NORVASC) 10 MG tablet   Other Relevant Orders   Comprehensive metabolic panel     Nervous and Auditory    Generalized convulsive epilepsy (HCC)    Stable.  No recent seizures Continues Tegretol 400 mg at bedtime        Other   Dyslipidemia    Stable chronic condition Diet and nutrition discussed Lipid profile done today Continue rosuvastatin 20 mg daily.  The 10-year ASCVD risk score (Arnett DK, et al., 2019) is: 16.8%   Values used to calculate the score:     Age: 4 years     Sex: Male     Is Non-Hispanic African American: No     Diabetic: No     Tobacco smoker: No     Systolic Blood Pressure: 138 mmHg     Is BP treated: Yes     HDL Cholesterol: 57.5 mg/dL     Total Cholesterol: 202 mg/dL       Relevant Orders   Lipid panel   Other Visit Diagnoses     Encounter for general adult medical examination with abnormal findings    -  Primary   Relevant Orders   CBC with Differential   Comprehensive metabolic panel   Hemoglobin A1c   Lipid panel   PSA(Must document that pt has been informed of limitations of PSA testing.)   Screening for prostate cancer       Relevant Orders   PSA(Must document that pt has been informed of limitations of PSA testing.)   Screening for deficiency anemia       Relevant Orders   CBC with Differential   Screening for endocrine, metabolic and immunity disorder       Relevant Orders   Comprehensive metabolic panel   Hemoglobin A1c      Modifiable risk factors discussed with patient. Anticipatory guidance according to age provided. The following topics were also discussed: Social Determinants of Health Smoking.  Non-smoking Diet and nutrition Benefits of exercise Cancer screening and review of most recent colonoscopy report Vaccinations review and recommendations Cardiovascular risk assessment The 10-year ASCVD risk score (Arnett DK, et al., 2019) is: 16.8%   Values used to calculate the score:     Age: 52 years     Sex: Male     Is Non-Hispanic African American: No     Diabetic: No     Tobacco smoker: No     Systolic Blood Pressure:  138 mmHg     Is BP treated: Yes     HDL Cholesterol: 57.5 mg/dL     Total Cholesterol: 202 mg/dL Review of multiple chronic medical conditions under management Review of all medications Mental health including depression and anxiety Fall and accident prevention  Patient Instructions  Health Maintenance, Male Adopting a healthy lifestyle and getting preventive care are important in promoting health and wellness. Ask your health care provider about: The right schedule for you to have regular tests and exams. Things you can do on your own to prevent diseases and keep yourself healthy. What should I know about diet, weight, and exercise? Eat a healthy diet  Eat a diet that includes plenty of vegetables, fruits, low-fat dairy products, and lean protein. Do not eat a lot of foods that are high in solid fats, added sugars, or sodium. Maintain a healthy weight Body mass index (BMI) is a measurement that can be used to identify possible weight problems. It estimates body fat based on height and weight. Your health care provider can help determine your BMI and help you achieve or maintain a healthy weight. Get regular exercise Get regular exercise. This is one of the most important things you can do for your health. Most adults should: Exercise for at least 150 minutes each week. The exercise should increase your heart rate and make you sweat (moderate-intensity exercise). Do strengthening exercises at least twice a week. This is in addition to the  moderate-intensity exercise. Spend less time sitting. Even light physical activity can be beneficial. Watch cholesterol and blood lipids Have your blood tested for lipids and cholesterol at 66 years of age, then have this test every 5 years. You may need to have your cholesterol levels checked more often if: Your lipid or cholesterol levels are high. You are older than 66 years of age. You are at high risk for heart disease. What should I know about  cancer screening? Many types of cancers can be detected early and may often be prevented. Depending on your health history and family history, you may need to have cancer screening at various ages. This may include screening for: Colorectal cancer. Prostate cancer. Skin cancer. Lung cancer. What should I know about heart disease, diabetes, and high blood pressure? Blood pressure and heart disease High blood pressure causes heart disease and increases the risk of stroke. This is more likely to develop in people who have high blood pressure readings or are overweight. Talk with your health care provider about your target blood pressure readings. Have your blood pressure checked: Every 3-5 years if you are 37-59 years of age. Every year if you are 47 years old or older. If you are between the ages of 39 and 29 and are a current or former smoker, ask your health care provider if you should have a one-time screening for abdominal aortic aneurysm (AAA). Diabetes Have regular diabetes screenings. This checks your fasting blood sugar level. Have the screening done: Once every three years after age 77 if you are at a normal weight and have a low risk for diabetes. More often and at a younger age if you are overweight or have a high risk for diabetes. What should I know about preventing infection? Hepatitis B If you have a higher risk for hepatitis B, you should be screened for this virus. Talk with your health care provider to find out if you are at risk for hepatitis B infection. Hepatitis C Blood testing is recommended for: Everyone born from [birth year] through 1964. Anyone with known risk factors for hepatitis C. Sexually transmitted infections (STIs) You should be screened each year for STIs, including gonorrhea and chlamydia, if: You are sexually active and are younger than 66 years of age. You are older than 66 years of age and your health care provider tells you that you are at risk for this type  of infection. Your sexual activity has changed since you were last screened, and you are at increased risk for chlamydia or gonorrhea. Ask your health care provider if you are at risk. Ask your health care provider about whether you are at high risk for HIV. Your health care provider may recommend a prescription medicine to help prevent HIV infection. If you choose to take medicine to prevent HIV, you should first get tested for HIV. You should then be tested every 3 months for as long as you are taking the medicine. Follow these instructions at home: Alcohol use Do not drink alcohol if your health care provider tells you not to drink. If you drink alcohol: Limit how much you have to 0-2 drinks a day. Know how much alcohol is in your drink. In the [place] one drink equals one 12 oz bottle of beer (355 mL), one 5 oz glass of wine (148 mL), or one 1 oz glass of hard liquor (44 mL). Lifestyle Do not use any products that contain nicotine or tobacco. These products include cigarettes, chewing tobacco, and  vaping devices, such as e-cigarettes. If you need help quitting, ask your health care provider. Do not use street drugs. Do not share needles. Ask your health care provider for help if you need support or information about quitting drugs. General instructions Schedule regular health, dental, and eye exams. Stay current with your vaccines. Tell your health care provider if: You often feel depressed. You have ever been abused or do not feel safe at home. Summary Adopting a healthy lifestyle and getting preventive care are important in promoting health and wellness. Follow your health care provider's instructions about healthy diet, exercising, and getting tested or screened for diseases. Follow your health care provider's instructions on monitoring your cholesterol and blood pressure. This information is not intended to replace advice given to you by your health care provider. Make sure you discuss  any questions you have with your health care provider. Document Revised: 05/03/2021 Document Reviewed: 05/03/2021 Elsevier Patient Education  2024 Elsevier Inc.     Edwina Barth, MD [facility]

## 2023-09-04 NOTE — Assessment & Plan Note (Signed)
Stable.  No recent seizures Continues Tegretol 400 mg at bedtime

## 2023-09-04 NOTE — Patient Instructions (Signed)
Health Maintenance, Male Adopting a healthy lifestyle and getting preventive care are important in promoting health and wellness. Ask your health care provider about: The right schedule for you to have regular tests and exams. Things you can do on your own to prevent diseases and keep yourself healthy. What should I know about diet, weight, and exercise? Eat a healthy diet  Eat a diet that includes plenty of vegetables, fruits, low-fat dairy products, and lean protein. Do not eat a lot of foods that are high in solid fats, added sugars, or sodium. Maintain a healthy weight Body mass index (BMI) is a measurement that can be used to identify possible weight problems. It estimates body fat based on height and weight. Your health care provider can help determine your BMI and help you achieve or maintain a healthy weight. Get regular exercise Get regular exercise. This is one of the most important things you can do for your health. Most adults should: Exercise for at least 150 minutes each week. The exercise should increase your heart rate and make you sweat (moderate-intensity exercise). Do strengthening exercises at least twice a week. This is in addition to the moderate-intensity exercise. Spend less time sitting. Even light physical activity can be beneficial. Watch cholesterol and blood lipids Have your blood tested for lipids and cholesterol at 66 years of age, then have this test every 5 years. You may need to have your cholesterol levels checked more often if: Your lipid or cholesterol levels are high. You are older than 66 years of age. You are at high risk for heart disease. What should I know about cancer screening? Many types of cancers can be detected early and may often be prevented. Depending on your health history and family history, you may need to have cancer screening at various ages. This may include screening for: Colorectal cancer. Prostate cancer. Skin cancer. Lung  cancer. What should I know about heart disease, diabetes, and high blood pressure? Blood pressure and heart disease High blood pressure causes heart disease and increases the risk of stroke. This is more likely to develop in people who have high blood pressure readings or are overweight. Talk with your health care provider about your target blood pressure readings. Have your blood pressure checked: Every 3-5 years if you are 18-39 years of age. Every year if you are 40 years old or older. If you are between the ages of 65 and 75 and are a current or former smoker, ask your health care provider if you should have a one-time screening for abdominal aortic aneurysm (AAA). Diabetes Have regular diabetes screenings. This checks your fasting blood sugar level. Have the screening done: Once every three years after age 45 if you are at a normal weight and have a low risk for diabetes. More often and at a younger age if you are overweight or have a high risk for diabetes. What should I know about preventing infection? Hepatitis B If you have a higher risk for hepatitis B, you should be screened for this virus. Talk with your health care provider to find out if you are at risk for hepatitis B infection. Hepatitis C Blood testing is recommended for: Everyone born from 1945 through 1965. Anyone with known risk factors for hepatitis C. Sexually transmitted infections (STIs) You should be screened each year for STIs, including gonorrhea and chlamydia, if: You are sexually active and are younger than 66 years of age. You are older than 66 years of age and your   health care provider tells you that you are at risk for this type of infection. Your sexual activity has changed since you were last screened, and you are at increased risk for chlamydia or gonorrhea. Ask your health care provider if you are at risk. Ask your health care provider about whether you are at high risk for HIV. Your health care provider  may recommend a prescription medicine to help prevent HIV infection. If you choose to take medicine to prevent HIV, you should first get tested for HIV. You should then be tested every 3 months for as long as you are taking the medicine. Follow these instructions at home: Alcohol use Do not drink alcohol if your health care provider tells you not to drink. If you drink alcohol: Limit how much you have to 0-2 drinks a day. Know how much alcohol is in your drink. In the [place] one drink equals one 12 oz bottle of beer (355 mL), one 5 oz glass of wine (148 mL), or one 1 oz glass of hard liquor (44 mL). Lifestyle Do not use any products that contain nicotine or tobacco. These products include cigarettes, chewing tobacco, and vaping devices, such as e-cigarettes. If you need help quitting, ask your health care provider. Do not use street drugs. Do not share needles. Ask your health care provider for help if you need support or information about quitting drugs. General instructions Schedule regular health, dental, and eye exams. Stay current with your vaccines. Tell your health care provider if: You often feel depressed. You have ever been abused or do not feel safe at home. Summary Adopting a healthy lifestyle and getting preventive care are important in promoting health and wellness. Follow your health care provider's instructions about healthy diet, exercising, and getting tested or screened for diseases. Follow your health care provider's instructions on monitoring your cholesterol and blood pressure. This information is not intended to replace advice given to you by your health care provider. Make sure you discuss any questions you have with your health care provider. Document Revised: 05/03/2021 Document Reviewed: 05/03/2021 Elsevier Patient Education  2024 [employer]vier Inc.  

## 2023-09-28 ENCOUNTER — Encounter: Payer: Self-pay | Admitting: Emergency Medicine

## 2023-09-28 ENCOUNTER — Ambulatory Visit: Payer: No Typology Code available for payment source | Admitting: Emergency Medicine

## 2023-09-28 ENCOUNTER — Ambulatory Visit: Payer: No Typology Code available for payment source

## 2023-09-28 VITALS — BP 118/74 | HR 60 | Temp 98.5°F | Ht 71.0 in | Wt 192.1 lb

## 2023-09-28 DIAGNOSIS — S4991XA Unspecified injury of right shoulder and upper arm, initial encounter: Secondary | ICD-10-CM | POA: Insufficient documentation

## 2023-09-28 DIAGNOSIS — M25511 Pain in right shoulder: Secondary | ICD-10-CM | POA: Insufficient documentation

## 2023-09-28 MED ORDER — MELOXICAM 15 MG PO TABS: 15.0000 mg | ORAL_TABLET | Freq: Every day | ORAL | 0 refills | Status: DC

## 2023-09-28 NOTE — Assessment & Plan Note (Signed)
Secondary to injury 6 weeks ago Persistent pain.  Recommend meloxicam 15 mg daily for 10 days Recommend sports medicine evaluation X-ray done today

## 2023-09-28 NOTE — Progress Notes (Signed)
Shane Phillips 67 y.o.   Chief Complaint  Patient presents with   Shoulder Pain    Patient states he fell about 2-3 months ago and is not having right shoulder pain     HISTORY OF PRESENT ILLNESS: Acute problem visit today. This is a 67 y.o. male injured right shoulder about 6 weeks ago still having persistent pain  Shoulder Pain  Pertinent negatives include no fever.     Prior to Admission medications   Medication Sig Start Date End Date Taking? Authorizing Provider  amLODipine (NORVASC) 10 MG tablet Take 1 tablet (10 mg total) by mouth daily. 09/04/23  Yes Timothy Trudell, Eilleen Kempf, MD  carbamazepine (TEGRETOL) 200 MG tablet TAKE TWO TABLETS BY MOUTH EVERY NIGHT AT BEDTIME 11/27/22  Yes Cass Edinger, Eilleen Kempf, MD  co-enzyme Q-10 30 MG capsule Take 30 mg by mouth daily.   Yes [provider]  GLUCOSAMINE-CHONDROITIN PO Take by mouth daily.   Yes [provider]  Omega-3 Fatty Acids (FISH OIL PO) Take by mouth daily.   Yes [provider]  rosuvastatin (CRESTOR) 20 MG tablet TAKE ONE TABLET BY MOUTH DAILY 01/10/23  Yes Georgina Quint, MD    No Known Allergies  Patient Active Problem List   Diagnosis Date Noted   Dyslipidemia 11/19/2019   Hyperlipidemia 08/19/2019   Essential hypertension 09/05/2017   Localization-related focal epilepsy with simple partial seizures (HCC) 08/05/2013   Generalized convulsive epilepsy (HCC) 08/05/2013    Past Medical History:  Diagnosis Date   Arthritis    hands    High cholesterol    Hypertension    Seizure (HCC)    last sz 2001    Past Surgical History:  Procedure Laterality Date   COLONOSCOPY  10/15/2007   None      Social History   Socioeconomic History   Marital status: Married    Spouse name: Rosey Bath   Number of children: Not on file   Years of education: Not on file   Highest education level: Not on file  Occupational History   Occupation: Self Empolyed  Tobacco Use   Smoking status: Never    Smokeless tobacco: Never  Substance and Sexual Activity   Alcohol use: No   Drug use: Yes    Types: "Crack" cocaine   Sexual activity: Yes  Other Topics Concern   Not on file  Social History Narrative   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   Patient is Married to [place].       Social Determinants of Health   Financial Resource Strain: Not on file  Food Insecurity: Not on file  Transportation Needs: Not on file  Physical Activity: Not on file  Stress: Not on file  Social Connections: Not on file  Intimate Partner Violence: Not on file    Family History  Problem Relation Age of Onset   Leukemia Mother    Cancer Mother    Heart attack Brother        2 in 6 months    Hyperlipidemia Brother    Cancer Sister        breast cancer   Hyperlipidemia Sister    Breast cancer Sister    Cancer Father        bladder cancer   Cancer Maternal Grandfather        lung cancer   Cancer Paternal Grandfather        melanoma   Hyperlipidemia Sister    Colon cancer Neg Hx  Colon polyps Neg Hx    Esophageal cancer Neg Hx    Rectal cancer Neg Hx    Stomach cancer Neg Hx      Review of Systems  Constitutional: Negative.  Negative for chills and fever.  HENT: Negative.  Negative for congestion and sore throat.   Respiratory: Negative.  Negative for cough and shortness of breath.   Cardiovascular: Negative.  Negative for chest pain and palpitations.  Gastrointestinal:  Negative for abdominal pain, nausea and vomiting.  Musculoskeletal:  Positive for joint pain (Right shoulder pain).  Skin: Negative.  Negative for rash.  Neurological: Negative.  Negative for dizziness and headaches.  All other systems reviewed and are negative.   Vitals:   09/28/23 1409  BP: 118/74  Pulse: 60  Temp: 98.5 F (36.9 C)  SpO2: 97%    Physical Exam Vitals reviewed.  Constitutional:      Appearance: Normal appearance.  HENT:     Head: Normocephalic.  Eyes:     Extraocular Movements:  Extraocular movements intact.  Cardiovascular:     Rate and Rhythm: Normal rate.  Pulmonary:     Effort: Pulmonary effort is normal.  Musculoskeletal:     Comments: Right shoulder: Limited range of motion due to pain.  No swelling or ecchymosis  Skin:    General: Skin is warm and dry.  Neurological:     Mental Status: He is alert and oriented to person, place, and time.  Psychiatric:        Mood and Affect: Mood normal.        Behavior: Behavior normal.     ASSESSMENT & PLAN: A total of 32 minutes was spent with the patient and counseling/coordination of care regarding preparing for this visit, review of most recent office visit notes, review of all medications, differential diagnosis of right shoulder pain and need for orthopedic evaluation, pain management, review of x-ray report done today, prognosis, documentation, and need for follow-up.  Problem List Items Addressed This Visit       Other   Acute pain of right shoulder - Primary    Secondary to injury 6 weeks ago Persistent pain.  Recommend meloxicam 15 mg daily for 10 days Recommend sports medicine evaluation X-ray done today      Relevant Orders   DG Shoulder Right   Ambulatory referral to Sports Medicine   Injury of right shoulder    With persistent pain and limited range of motion Differential diagnosis discussed X-ray done today.  Report reviewed Recommend orthopedic evaluation Referral to sports medicine placed today.      Relevant Orders   DG Shoulder Right   Ambulatory referral to Sports Medicine   Patient Instructions  Shoulder Pain Many things can cause shoulder pain, including: An injury. Moving the shoulder in the same way again and again (overuse). Joint pain (arthritis). Pain can come from: Swelling and irritation (inflammation) of any part of the shoulder. An injury to: The shoulder joint. Tissues that connect muscle to bone (tendons). Tissues that connect bones to each other  (ligaments). Bones. Follow these instructions at home: Watch for changes in your symptoms. Let your doctor know about them. Follow these instructions to help with your pain. If you have a sling that can be taken off: Wear the sling as told by your doctor. Take it off only as told by your doctor. Check the skin around the sling every day. Tell your doctor if you see problems. Loosen the sling if your fingers: Tingle.  Become numb. Become cold. Keep the sling clean. If the sling is not waterproof: Do not let it get wet. Take the sling off when you shower or bathe. Managing pain, stiffness, and swelling  If told, put ice on the painful area. Put ice in a plastic bag. Place a towel between your skin and the bag. Leave the ice on for 20 minutes, 2-3 times a day. Stop putting ice on if it does not help with the pain. If your skin turns bright red, take off the ice right away to prevent skin damage. The risk of damage is higher if you cannot feel pain, heat, or cold. Squeeze a soft ball or a foam pad as much as possible. This prevents swelling in the shoulder. It also helps to strengthen the arm. General instructions Take over-the-counter and prescription medicines only as told by your doctor. Keep all follow-up visits. This will help you avoid any type of permanent shoulder problems. Contact a doctor if: Your pain gets worse. Medicine does not help your pain. You have new pain in your arm, hand, or fingers. You loosen your sling and your arm, hand, or fingers: Tingle. Are numb. Are swollen. Get help right away if: Your arm, hand, or fingers turn white or blue. This information is not intended to replace advice given to you by your health care provider. Make sure you discuss any questions you have with your health care provider. Document Revised: 07/15/2022 Document Reviewed: 07/15/2022 Elsevier Patient Education  2024 Elsevier Inc.     Edwina Barth, MD [facility] at  [facility]

## 2023-09-28 NOTE — Assessment & Plan Note (Signed)
With persistent pain and limited range of motion Differential diagnosis discussed X-ray done today.  Report reviewed Recommend orthopedic evaluation Referral to sports medicine placed today.

## 2023-09-28 NOTE — Patient Instructions (Signed)
Shoulder Pain Many things can cause shoulder pain, including: An injury. Moving the shoulder in the same way again and again (overuse). Joint pain (arthritis). Pain can come from: Swelling and irritation (inflammation) of any part of the shoulder. An injury to: The shoulder joint. Tissues that connect muscle to bone (tendons). Tissues that connect bones to each other (ligaments). Bones. Follow these instructions at home: Watch for changes in your symptoms. Let your doctor know about them. Follow these instructions to help with your pain. If you have a sling that can be taken off: Wear the sling as told by your doctor. Take it off only as told by your doctor. Check the skin around the sling every day. Tell your doctor if you see problems. Loosen the sling if your fingers: Tingle. Become numb. Become cold. Keep the sling clean. If the sling is not waterproof: Do not let it get wet. Take the sling off when you shower or bathe. Managing pain, stiffness, and swelling  If told, put ice on the painful area. Put ice in a plastic bag. Place a towel between your skin and the bag. Leave the ice on for 20 minutes, 2-3 times a day. Stop putting ice on if it does not help with the pain. If your skin turns bright red, take off the ice right away to prevent skin damage. The risk of damage is higher if you cannot feel pain, heat, or cold. Squeeze a soft ball or a foam pad as much as possible. This prevents swelling in the shoulder. It also helps to strengthen the arm. General instructions Take over-the-counter and prescription medicines only as told by your doctor. Keep all follow-up visits. This will help you avoid any type of permanent shoulder problems. Contact a doctor if: Your pain gets worse. Medicine does not help your pain. You have new pain in your arm, hand, or fingers. You loosen your sling and your arm, hand, or fingers: Tingle. Are numb. Are swollen. Get help right away  if: Your arm, hand, or fingers turn white or blue. This information is not intended to replace advice given to you by your health care provider. Make sure you discuss any questions you have with your health care provider. Document Revised: 07/15/2022 Document Reviewed: 07/15/2022 Elsevier Patient Education  2024 Elsevier Inc.  

## 2023-09-29 ENCOUNTER — Telehealth: Payer: Self-pay

## 2023-09-29 ENCOUNTER — Ambulatory Visit (INDEPENDENT_AMBULATORY_CARE_PROVIDER_SITE_OTHER): Payer: No Typology Code available for payment source | Admitting: Family Medicine

## 2023-09-29 ENCOUNTER — Other Ambulatory Visit: Payer: Self-pay

## 2023-09-29 VITALS — BP 152/92 | HR 53 | Ht 71.0 in | Wt 188.0 lb

## 2023-09-29 DIAGNOSIS — M25511 Pain in right shoulder: Secondary | ICD-10-CM

## 2023-09-29 DIAGNOSIS — S4991XA Unspecified injury of right shoulder and upper arm, initial encounter: Secondary | ICD-10-CM

## 2023-09-29 MED ORDER — TIZANIDINE HCL 4 MG PO TABS: 4.0000 mg | ORAL_TABLET | Freq: Every evening | ORAL | 0 refills | Status: DC | PRN

## 2023-09-29 NOTE — Telephone Encounter (Signed)
Rx clarification  Received Rx for Tizanidine  Qty: 1

## 2023-09-29 NOTE — Progress Notes (Signed)
Rubin Payor, PhD, LAT, ATC acting as a scribe for [name], MD.  Shane Phillips is a 67 y.o. male who presents to [facility] at [facility] today for R shoulder pain ongoing for about 2-3 months after suffering a fall. He is RHD. He stepped off the edge of his driveway, landing on his R shoulder. Pt locates pain to the superior aspect of his R shoulder. He owns a cleaning business.   Radiates: yes- into scapular region and R-side of his neck.  Aggravates: IR Treatments tried: IBU, ice, heat,     Dx imaging: 09/28/23 R shoulder XR  Pertinent review of systems: No fevers or chills  Relevant historical information: History of epilepsy.  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   Exam:  BP (!) 152/92   Pulse (!) 53   Ht 5\' 11"  (1.803 m)   Wt 188 lb (85.3 kg)   SpO2 98%   BMI 26.22 kg/m  General: Well Developed, well nourished, and in no acute distress.   MSK: Right shoulder normal-appearing Tender palpation AC joint.  Normal shoulder motion some pain with abduction. Intact strength. Negative Hawkins and Neer's test.  Negative empty can test. Negative Yergason's and speeds test.    Lab and Radiology Results No results found for this or any previous visit (from the past 72 hour(s)). DG Shoulder Right  Result Date: 09/28/2023 CLINICAL DATA:  Shoulder pain after injury.  Fall 2 weeks ago. EXAM: RIGHT SHOULDER - 2+ VIEW COMPARISON:  03/14/2018 FINDINGS: There is no evidence of fracture or dislocation. Chronic acromioclavicular degenerative spurring. No erosion or focal bone abnormality. Soft tissues are unremarkable. Included ribs are intact. IMPRESSION: 1. No fracture or dislocation. 2. Chronic acromioclavicular degenerative spurring. Electronically Signed   By: Narda Rutherford M.D.   On: 09/28/2023 16:43    I, [name], personally (independently) visualized and performed the interpretation of the images attached in this  note.  Diagnostic Limited MSK Ultrasound of: Right shoulder Biceps tendon intact normal-appearing Subscapularis tendon is intact and normal-appearing Supraspinatus tendon is intact.  Some subacromial bursitis is present. Infraspinatus tendon intact normal-appearing. AC joint degenerative with large effusion. Impression: AC DJD.   Assessment and Plan: 67 y.o. male with right shoulder pain after a fall.  I think he exacerbated underlying DJD of the acromioclavicular joint and effectively has somewhere between a separated shoulder and exacerbation of arthritis.  This should improve with basic conservative management including physical therapy.  Recommend Tylenol limited ibuprofen and topical Voltaren gel Plan to refer to PT and recheck in 6 weeks.  Please tizanidine predominantly at bedtime as needed.  PDMP not reviewed this encounter. Orders Placed This Encounter  Procedures   [place] LIMITED JOINT SPACE STRUCTURES UP RIGHT(NO LINKED CHARGES)    Order Specific Question:   Reason for Exam (SYMPTOM  OR DIAGNOSIS REQUIRED)    Answer:   right shoulder pain    Order Specific Question:   Preferred imaging location?    Answer:   [facility] referral to Physical Therapy    Referral Priority:   Routine    Referral Type:   Physical Medicine    Referral Reason:   Specialty Services Required    Requested Specialty:   Physical Therapy    Number of Visits Requested:   1   Meds ordered this encounter  Medications   tiZANidine (ZANAFLEX) 4 MG tablet    Sig: Take  1 tablet (4 mg total) by mouth at bedtime as needed for muscle spasms.    Dispense:  1 tablet    Refill:  0     Discussed warning signs or symptoms. Please see discharge instructions. Patient expresses understanding.   The above documentation has been reviewed and is accurate and complete [name], M.D.

## 2023-09-29 NOTE — Patient Instructions (Addendum)
Thank you for coming in today.   I've referred you to Physical Therapy.  Let us know if you don't hear from them in one week.   I sent tizanidine to your pharmacy. Take this at bedtime  Check back in 6 weeks  Let me know if you are not getting better.

## 2023-10-02 DIAGNOSIS — E663 Overweight: Secondary | ICD-10-CM | POA: Diagnosis not present

## 2023-10-02 DIAGNOSIS — R7303 Prediabetes: Secondary | ICD-10-CM | POA: Diagnosis not present

## 2023-10-02 DIAGNOSIS — M1990 Unspecified osteoarthritis, unspecified site: Secondary | ICD-10-CM | POA: Diagnosis not present

## 2023-10-02 DIAGNOSIS — Z6825 Body mass index (BMI) 25.0-25.9, adult: Secondary | ICD-10-CM | POA: Diagnosis not present

## 2023-10-02 DIAGNOSIS — Z008 Encounter for other general examination: Secondary | ICD-10-CM | POA: Diagnosis not present

## 2023-10-12 NOTE — Therapy (Signed)
OUTPATIENT PHYSICAL THERAPY EVALUATION   Patient Name: Shane Phillips MRN: [id number] DOB:1956/07/24, 67 y.o., male Today's Date: 10/13/2023  END OF SESSION:  PT End of Session - 10/13/23 1040     Visit Number 1    Number of Visits 20    Date for PT Re-Evaluation 12/22/23    Authorization Type [employer] $15 copay    Progress Note Due on Visit 10    PT Start Time 1054    PT Stop Time 1134    PT Time Calculation (min) 40 min    Activity Tolerance Patient tolerated treatment well    Behavior During Therapy [facility] for tasks assessed/performed             Past Medical History:  Diagnosis Date   Arthritis    hands    High cholesterol    Hypertension    Seizure (HCC)    last sz 2001   Past Surgical History:  Procedure Laterality Date   COLONOSCOPY  10/15/2007   None     Patient Active Problem List   Diagnosis Date Noted   Acute pain of right shoulder 09/28/2023   Injury of right shoulder 09/28/2023   Dyslipidemia 11/19/2019   Hyperlipidemia 08/19/2019   Essential hypertension 09/05/2017   Localization-related focal epilepsy with simple partial seizures (HCC) 08/05/2013   Generalized convulsive epilepsy (HCC) 08/05/2013    PCP: Georgina Quint  REFERRING PROVIDER: Rodolph Bong, MD  REFERRING DIAG: [phone] (ICD-10-CM) - Injury of right shoulder, initial encounter  THERAPY DIAG:  Chronic right shoulder pain  Muscle weakness (generalized)  Rationale for Evaluation and Treatment: Rehabilitation  ONSET DATE: Acute on chronic, approx 2-3 months  SUBJECTIVE:                                                                                                                                                                                      SUBJECTIVE STATEMENT: Complaints of Rt shoulder pain onset 2-3 months after a fall.   Pt indicated pain can be present with certain.  Pt indicated symptoms can be constant but variable in intensity.   Reported reaching  backward is troublesome.  Reported some restriction in lifting due to symptoms.  Repetitive cleaning movement can be aggravating.  Pt indicated sleeping on Rt side is painful.    Pt indicated several years ago pushing hard overhead that caused some trouble.   PERTINENT HISTORY: High cholesterol, HTN, hand arthritis,  Xrays showed no evidence of fracture.   PAIN:  NPRS scale: current 3-4/10  , at worst 8-9/10 Pain location: Rt shoulder superior.  Pain description: sharp at times, consistent Aggravating factors: reaching behind back Relieving  factors: OTC medicine   PRECAUTIONS: None  WEIGHT BEARING RESTRICTIONS: No  FALLS:  Has patient fallen in last 6 months? 1 fall   LIVING ENVIRONMENT:  Lives in: House/apartment   OCCUPATION: Owns cleaning business  PLOF: Independent, Rt handed   PATIENT GOALS:Reduce pain, improve movement.   OBJECTIVE:   PATIENT SURVEYS:  10/13/2023 FOTO intake: 65    predicted:  71  COGNITION: 10/13/2023 Overall cognitive status: WFL     SENSATION: 10/13/2023 WFL  POSTURE: 10/13/2023 Protraction and anterior tilt of scapulae.   UPPER EXTREMITY ROM:   ROM Right 10/13/2023 AROM in supine  Left 10/13/2023  Shoulder flexion [facility]   Shoulder extension    Shoulder abduction WFL   Shoulder adduction    Shoulder internal rotation 55 in 45 deg abduction supine   Shoulder external rotation 90 in 45 deg abduction supine   Elbow flexion    Elbow extension    Wrist flexion    Wrist extension                HBB T12 T8  (Blank rows = not tested)  UPPER EXTREMITY MMT:  MMT Right 10/13/2023 Left 10/13/2023  Shoulder flexion 5/5 5/5  Shoulder extension    Shoulder abduction 5/5 5/5  Shoulder adduction    Shoulder internal rotation 5/5 5/5  Shoulder external rotation 4/5 5/5  Middle trapezius    Lower trapezius    Elbow flexion 5/5 5/5  Elbow extension 5/5 5/5  Wrist flexion    Wrist extension    Wrist ulnar deviation     Wrist radial deviation    Wrist pronation    Wrist supination    Grip strength (lbs)    (Blank rows = not tested)  SHOULDER SPECIAL TESTS: 10/13/2023 Negative painful arc, drop arm Rt.   JOINT MOBILITY TESTING:  10/13/2023 Normal   PALPATION:  10/13/2023 Trigger points with concordant symptoms numerous in Rt infraspinatus                                                                                                                                                                                                  TODAY'S TREATMENT:  DATE:10/13/2023 Therex:    HEP instruction/performance c cues for techniques, handout provided.  Trial set performed of each for comprehension and symptom assessment.  See below for exercise list  Manual Compression to Rt infraspinatus trigger points, Skilled palpation with needling.   Trigger Point Dry-Needling  Treatment instructions: Expect mild to moderate muscle soreness. S/S of pneumothorax if dry needled over a lung field, and to seek immediate medical attention should they occur. Patient verbalized understanding of these instructions and education.  Patient Consent Given: Yes Education handout provided: Yes Muscles treated: Rt infraspinatus Treatment response/outcome: twitch response c concordant symptom    PATIENT EDUCATION: 10/13/2023 Education details: HEP, POC Person educated: Patient Education method: Programmer, multimedia, Demonstration, Verbal cues, and Handouts Education comprehension: verbalized understanding, returned demonstration, and verbal cues required  HOME EXERCISE PROGRAM: Access Code: [email or web address] URL: [email or web address] Date: 10/13/2023 Prepared by: Chyrel Masson  Exercises - Seated Scapular Retraction  - 2-3 x daily - 7 x weekly - 1 sets - 10 reps - 3-5 hold - Standing Shoulder Posterior Capsule Stretch  (Mirrored)  - 2-3 x daily - 7 x weekly - 1 sets - 5 reps - 15 hold - Shoulder External Rotation with Anchored Resistance (Mirrored)  - 1 x daily - 7 x weekly - 2-3 sets - 10 reps - Shoulder External Rotation Reactive Isometrics (Mirrored)  - 1 x daily - 7 x weekly - 1 sets - 15 reps - 5 hold  ASSESSMENT:  CLINICAL IMPRESSION: Patient is a 67 y.o. who comes to clinic with complaints of Rt shoulder pain with mobility, strength and movement coordination deficits that impair their ability to perform usual daily and recreational functional activities without increase difficulty/symptoms at this time.  Patient to benefit from skilled PT services to address impairments and limitations to improve to previous level of function without restriction secondary to condition.   OBJECTIVE IMPAIRMENTS: decreased activity tolerance, decreased endurance, decreased mobility, decreased ROM, decreased strength, increased fascial restrictions, impaired perceived functional ability, impaired flexibility, impaired UE functional use, improper body mechanics, postural dysfunction, and pain.   ACTIVITY LIMITATIONS: carrying, lifting, sleeping, dressing, reach over head, and hygiene/grooming  PARTICIPATION LIMITATIONS: cleaning, community activity, occupation, and yard work  PERSONAL FACTORS:  High cholesterol, HTN, hand arthritis,   are also affecting patient's functional outcome.   REHAB POTENTIAL: Good  CLINICAL DECISION MAKING: Stable/uncomplicated  EVALUATION COMPLEXITY: Low   GOALS: Goals reviewed with patient? Yes  SHORT TERM GOALS: (target date for Short term goals are 3 weeks 11/03/2023)  1.Patient will demonstrate independent use of home exercise program to maintain progress from in clinic treatments. Goal status: New  LONG TERM GOALS: (target dates for all long term goals are 10 weeks  12/22/2023 )   1. Patient will demonstrate/report pain at worst less than or equal to 2/10 to facilitate minimal  limitation in daily activity secondary to pain symptoms. Goal status: New   2. Patient will demonstrate independent use of home exercise program to facilitate ability to maintain/progress functional gains from skilled physical therapy services. Goal status: New   3. Patient will demonstrate FOTO outcome > or = 71 % to indicate reduced disability due to condition. Goal status: New   4.  Patient will demonstrate Rt UE MMT 5/5 throughout to facilitate lifting, reaching, carrying at [facility] in daily activity.   Goal status: New   5.  Patient will demonstrate Rt GH joint AROM WFL s symptoms to facilitate usual overhead reaching, self care, dressing at PLOF.    Goal  status: New   6.  Patient will demonstrate/report ability to perform work and sleep s restriction due to symptoms.  Goal status: New   PLAN:  PT FREQUENCY: 1-2x/week  PT DURATION: 10 weeks  PLANNED INTERVENTIONS: Can include 01601- PT Re-evaluation, 97110-Therapeutic exercises, 97530- Therapeutic activity, [id number]- Neuromuscular re-education, 97535- Self Care, 97140- Manual therapy, [id number]- Gait training, [id number]- Orthotic Fit/training, [phone]- Canalith repositioning, [id number]- Aquatic Therapy, 97014- Electrical stimulation (unattended), [id number]- Electrical stimulation (manual), [id number]- Vasopneumatic device, [id number]- Ultrasound, [id number]- Traction (mechanical), [id number]- Ionotophoresis 4mg /ml Dexamethasone, Patient/Family education, Balance training, Stair training, Taping, Dry Needling, Joint mobilization, Joint manipulation, Spinal manipulation, Spinal mobilization, Scar mobilization, Vestibular training, Visual/preceptual remediation/compensation, DME instructions, Cryotherapy, and Moist heat.  All performed as medically necessary.  All included unless contraindicated  PLAN FOR NEXT SESSION: Review HEP knowledge/results.   Dry needling Rt infraspinatus if desired.  Recheck range behind back and ER MMT.    Chyrel Masson, PT, DPT, OCS, ATC 10/13/23   11:38 AM

## 2023-10-13 ENCOUNTER — Other Ambulatory Visit: Payer: Self-pay

## 2023-10-13 ENCOUNTER — Ambulatory Visit: Payer: No Typology Code available for payment source | Admitting: Rehabilitative and Restorative Service Providers"

## 2023-10-13 ENCOUNTER — Encounter: Payer: Self-pay | Admitting: Rehabilitative and Restorative Service Providers"

## 2023-10-13 DIAGNOSIS — M6281 Muscle weakness (generalized): Secondary | ICD-10-CM | POA: Diagnosis not present

## 2023-10-13 DIAGNOSIS — M25511 Pain in right shoulder: Secondary | ICD-10-CM | POA: Diagnosis not present

## 2023-10-13 DIAGNOSIS — G8929 Other chronic pain: Secondary | ICD-10-CM

## 2023-10-18 ENCOUNTER — Encounter: Payer: No Typology Code available for payment source | Admitting: Physical Therapy

## 2023-10-23 ENCOUNTER — Encounter: Payer: No Typology Code available for payment source | Admitting: Physical Therapy

## 2023-10-31 ENCOUNTER — Encounter: Payer: No Typology Code available for payment source | Admitting: Rehabilitative and Restorative Service Providers"

## 2023-11-06 ENCOUNTER — Encounter: Payer: No Typology Code available for payment source | Admitting: Rehabilitative and Restorative Service Providers"

## 2023-11-07 ENCOUNTER — Encounter: Payer: No Typology Code available for payment source | Admitting: Rehabilitative and Restorative Service Providers"

## 2023-11-08 NOTE — Progress Notes (Unsigned)
   Rubin Payor, PhD, LAT, ATC acting as a scribe for [name], MD.  Shane Phillips is a 67 y.o. male who presents to [facility] at [facility] today for 6-wk f/u R shoulder pain. Pt was last seen by Dr. Denyse Amass on 09/29/23 and was advised to use Tylenol, limited IBU, tizanidine mostly at bedtime, and was referred to PT, only completing 1 visit (canceling all other visits).  Today, pt reports ***  Dx imaging: 09/28/23 R shoulder XR   Pertinent review of systems: ***  Relevant historical information: ***   Exam:  There were no vitals taken for this visit. General: Well Developed, well nourished, and in no acute distress.   MSK: ***    Lab and Radiology Results No results found for this or any previous visit (from the past 72 hour(s)). No results found.     Assessment and Plan: 67 y.o. male with ***   PDMP not reviewed this encounter. No orders of the defined types were placed in this encounter.  No orders of the defined types were placed in this encounter.    Discussed warning signs or symptoms. Please see discharge instructions. Patient expresses understanding.   ***

## 2023-11-09 ENCOUNTER — Other Ambulatory Visit: Payer: Self-pay

## 2023-11-09 ENCOUNTER — Encounter: Payer: Self-pay | Admitting: Family Medicine

## 2023-11-09 ENCOUNTER — Ambulatory Visit: Payer: No Typology Code available for payment source | Admitting: Family Medicine

## 2023-11-09 VITALS — BP 127/86 | HR 55 | Ht 71.0 in | Wt 193.0 lb

## 2023-11-09 DIAGNOSIS — M25511 Pain in right shoulder: Secondary | ICD-10-CM

## 2023-11-09 DIAGNOSIS — G8929 Other chronic pain: Secondary | ICD-10-CM

## 2023-11-09 NOTE — Patient Instructions (Addendum)
Thank you for coming in today.   Please use Voltaren gel (Generic Diclofenac Gel) up to 4x daily for pain as needed.  This is available over-the-counter as both the name brand Voltaren gel and the generic diclofenac gel.   Schedule back with physical therapy.  Let me know if not getting better.

## 2023-11-10 ENCOUNTER — Ambulatory Visit: Payer: No Typology Code available for payment source | Admitting: Family Medicine

## 2024-01-16 ENCOUNTER — Other Ambulatory Visit: Payer: Self-pay | Admitting: Emergency Medicine

## 2024-01-29 DIAGNOSIS — M9903 Segmental and somatic dysfunction of lumbar region: Secondary | ICD-10-CM | POA: Diagnosis not present

## 2024-01-29 DIAGNOSIS — M51361 Other intervertebral disc degeneration, lumbar region with lower extremity pain only: Secondary | ICD-10-CM | POA: Diagnosis not present

## 2024-01-29 DIAGNOSIS — M9905 Segmental and somatic dysfunction of pelvic region: Secondary | ICD-10-CM | POA: Diagnosis not present

## 2024-01-29 DIAGNOSIS — M9904 Segmental and somatic dysfunction of sacral region: Secondary | ICD-10-CM | POA: Diagnosis not present

## 2024-02-19 DIAGNOSIS — M9905 Segmental and somatic dysfunction of pelvic region: Secondary | ICD-10-CM | POA: Diagnosis not present

## 2024-02-19 DIAGNOSIS — M9904 Segmental and somatic dysfunction of sacral region: Secondary | ICD-10-CM | POA: Diagnosis not present

## 2024-02-19 DIAGNOSIS — M51361 Other intervertebral disc degeneration, lumbar region with lower extremity pain only: Secondary | ICD-10-CM | POA: Diagnosis not present

## 2024-02-19 DIAGNOSIS — M9903 Segmental and somatic dysfunction of lumbar region: Secondary | ICD-10-CM | POA: Diagnosis not present

## 2024-02-28 DIAGNOSIS — G40309 Generalized idiopathic epilepsy and epileptic syndromes, not intractable, without status epilepticus: Secondary | ICD-10-CM | POA: Diagnosis not present

## 2024-02-28 DIAGNOSIS — E663 Overweight: Secondary | ICD-10-CM | POA: Diagnosis not present

## 2024-02-28 DIAGNOSIS — E785 Hyperlipidemia, unspecified: Secondary | ICD-10-CM | POA: Diagnosis not present

## 2024-02-28 DIAGNOSIS — Z008 Encounter for other general examination: Secondary | ICD-10-CM | POA: Diagnosis not present

## 2024-02-28 DIAGNOSIS — I10 Essential (primary) hypertension: Secondary | ICD-10-CM | POA: Diagnosis not present

## 2024-02-28 DIAGNOSIS — Z6826 Body mass index (BMI) 26.0-26.9, adult: Secondary | ICD-10-CM | POA: Diagnosis not present

## 2024-03-20 NOTE — Therapy (Signed)
 OUTPATIENT PHYSICAL THERAPY SHOULDER EVALUATION   Patient Name: Shane Phillips MRN: [id number] DOB:12/12/1956, 68 y.o., male Today's Date: 03/21/2024  END OF SESSION:  PT End of Session - 03/21/24 0919     Visit Number 1    Number of Visits 12    Date for PT Re-Evaluation 06/13/24    PT Start Time 0845    PT Stop Time 0930    PT Time Calculation (min) 45 min    Behavior During Therapy [facility] for tasks assessed/performed             Past Medical History:  Diagnosis Date   Arthritis    hands    High cholesterol    Hypertension    Seizure (HCC)    last sz 2001   Past Surgical History:  Procedure Laterality Date   COLONOSCOPY  10/15/2007   None     Patient Active Problem List   Diagnosis Date Noted   Acute pain of right shoulder 09/28/2023   Injury of right shoulder 09/28/2023   Dyslipidemia 11/19/2019   Hyperlipidemia 08/19/2019   Essential hypertension 09/05/2017   Localization-related focal epilepsy with simple partial seizures (HCC) 08/05/2013   Generalized convulsive epilepsy (HCC) 08/05/2013    PCP: Georgina Quint, MD   REFERRING PROVIDER: Rodolph Bong, MD   REFERRING DIAG: M25.511 (ICD-10-CM) - Acute pain of right shoulder   THERAPY DIAG:  Chronic right shoulder pain  Muscle weakness (generalized)  Cervicalgia  Rationale for Evaluation and Treatment: Rehabilitation  ONSET DATE: acute on chronic pain, that started 03/16/24 without known cause.  SUBJECTIVE:                                                                                                                                                                                      SUBJECTIVE STATEMENT: He has had chronic shoulder pain, came to PT in past and got good relief from DN. He says this time the pain started in his left shoulder then went across his right shoulder and then into his neck, also was having shooting pains in his foot. He does not know how or why this happens.   Hand  dominance: Right  PERTINENT HISTORY: High cholesterol, HTN, hand arthritis,  Xrays showed no evidence of fracture.    PAIN:  NPRS scale: current 3-4/10  , at worst 8-9/10 Pain location: Rt  shoulder, neck, Rt foot Pain description: sharp at times, constant but worse with movement Aggravating factors: reaching quick, turning head, walking Relieving factors: heat, massage gun   PRECAUTIONS: None   WEIGHT BEARING RESTRICTIONS: No  PRECAUTIONS: None  RED FLAGS: None   WEIGHT BEARING RESTRICTIONS: No  FALLS:  Has patient fallen in last 6 months? No  OCCUPATION: Owns cleaning business  PLOF: Independent  PATIENT GOALS:Reduce pain, improve movement   NEXT MD VISIT:   OBJECTIVE:  Note: Objective measures were completed at Evaluation unless otherwise noted.  DIAGNOSTIC FINDINGS:  XR 09/28/23 IMPRESSION: 1. No fracture or dislocation. 2. Chronic acromioclavicular degenerative spurring.  PATIENT SURVEYS:  Patient-Specific Activity Scoring Scheme  "0" represents "unable to perform." "10" represents "able to perform at prior level. 0 1 2 3 4 5 6 7 8 9  10 (Date and Score)   Activity Eval     1. Quick movements with right arm 4     2. Turn neck to left or right  3    3. Walk without pain on right side of foot 3   Score 10/30    Total score = sum of the activity scores/number of activities Minimum detectable change (90%CI) for average score = 2 points Minimum detectable change (90%CI) for single activity score = 3 points     COGNITION: Overall cognitive status: Within functional limits for tasks assessed     CERVICAL ROM:   AROM A/PROM (deg) eval  Flexion 75%  Extension 75%  Right lateral flexion 50%  Left lateral flexion 50%  Right rotation 55 deg  Left rotation 55 deg   (Blank rows = not tested)   UPPER EXTREMITY ROM: grossly WNL bilat    UPPER EXTREMITY MMT:  MMT Right eval Left eval  Shoulder flexion 4 5  Shoulder extension    Shoulder  abduction 4+ 4+  Shoulder adduction    Shoulder internal rotation 5 5  Shoulder external rotation 4 5  Middle trapezius    Lower trapezius    Elbow flexion    Elbow extension    Wrist flexion    Wrist extension    Wrist ulnar deviation    Wrist radial deviation    Wrist pronation    Wrist supination    Grip strength (lbs)    (Blank rows = not tested)  SHOULDER SPECIAL TESTS: Impingement tests: Neer impingement test: negative and Hawkins/Kennedy impingement test: negative   PALPATION:  Pain and trigger points in bilat upper traps, and right infraspinatus   TODAY'S TREATMENT:  Eval HEP creation and review with demonstration and trial set preformed, see below for details Trigger Point Dry Needling (not included in billable time) Initial Treatment: Pt instructed on Dry Needling rational, procedures, and possible side effects. Pt instructed to expect mild to moderate muscle soreness later in the day and/or into the next day.  Pt instructed in methods to reduce muscle soreness. Pt instructed to continue prescribed HEP. Because Dry Needling was performed over or adjacent to a lung field, pt was educated on S/S of pneumothorax and to seek immediate medical attention should they occur.  Patient was educated on signs and symptoms of infection and other risk factors and advised to seek medical attention should they occur.  Patient verbalized understanding of these instructions and education.  Patient Verbal Consent Given: Yes Education Handout Provided: Yes Muscles Treated: lower cervical paraspinals bilat, upper trap bilat, Rt infraspinatus Electrical Stimulation Performed: No Treatment Response/Outcome:  good overall tolerance,twitch response noted   Moist heat X 10 min  to neck and upper back after DN, during HEP creation and review        PATIENT EDUCATION: Education details: HEP, PT plan of care Person educated: Patient Education method: Explanation, Demonstration,  Verbal cues, and Handouts Education comprehension: verbalized  understanding and needs further education   HOME EXERCISE PROGRAM: Access Code: [email or web address] URL: [email or web address] Date: 03/21/2024 Prepared by: Ivery Quale  Exercises - Seated Assisted Cervical Rotation with Towel  - 2 x daily - 6 x weekly - 1 sets - 10 reps - 5 sec hold - Seated Cervical Retraction  - 2 x daily - 6 x weekly - 1-2 sets - 10 reps - Seated Levator Scapulae Stretch  - 2 x daily - 6 x weekly - 3 sets - 30 hold - Seated Cervical Sidebending Stretch  - 2 x daily - 6 x weekly - 1 sets - 2 reps - 30 sec hold - Doorway Pec Stretch at 90 Degrees Abduction  - 2 x daily - 6 x weekly - 1 sets - 2 reps - 30 sec hold - Standing Shoulder Row with Anchored Resistance  - 2 x daily - 6 x weekly - 2 sets - 10-15 reps - Shoulder Extension with Resistance - Neutral  - 2 x daily - 6 x weekly - 2 sets - 10-15 reps  ASSESSMENT:  CLINICAL IMPRESSION: Patient is a 68 y.o. who comes to clinic with complaints of Rt>Lt shoulder pain and neck pain with mobility, strength and movement coordination deficits that impair their ability to perform usual daily and recreational functional activities without increase difficulty/symptoms at this time.  Patient to benefit from skilled PT services to address impairments and limitations to improve to previous level of function without restriction secondary to condition. .  OBJECTIVE IMPAIRMENTS: decreased activity tolerance, decreased shoulder mobility, decreased ROM, decreased strength, impaired flexibility, impaired UE use, postural dysfunction, and pain.  ACTIVITY LIMITATIONS: reaching, lifting, carry,  cleaning, driving, and or occupation  PERSONAL FACTORS:High cholesterol, HTN, hand arthritis  also affecting patient's functional outcome.  REHAB POTENTIAL: Good  CLINICAL DECISION MAKING: Stable/uncomplicated  EVALUATION COMPLEXITY: Low    GOALS: Short term PT Goals Target  date: 04/18/2024  Pt will be I and compliant with HEP. Baseline:  Goal status: New Pt will decrease pain by 25% overall Baseline: Goal status: New  Long term PT goals Target date:06/13/2024     Pt will improve Rt shoulder AROM to [facility] to improve functional reaching Baseline: Goal status: New Pt will improve  Rt shoulder strength to at least 4+/5 MMT to improve functional strength Baseline: Goal status: New Pt will improve PSFS to at least 20/30 functional to show improved function Baseline: Goal status: New Pt will reduce pain to overall less than 3/10 with usual activity and work activity. Baseline: Goal status: New  PLAN: PT FREQUENCY: 1-2 times per week   PT DURATION: 12 weeks  PLANNED INTERVENTIONS (unless contraindicated): aquatic PT, Canalith repositioning, cryotherapy, Electrical stimulation, Iontophoresis with 4 mg/ml dexamethasome, Moist heat, traction, Ultrasound, gait training, Therapeutic exercise, balance training, neuromuscular re-education, patient/family education, manual techniques, passive ROM, dry needling, taping, vasopnuematic device, vestibular, spinal manipulations, joint manipulations 97110-Therapeutic exercises, 97530- Therapeutic activity, 97112- Neuromuscular re-education, 97535- Self Care, and 16109- Manual therapy  PLAN FOR NEXT SESSION: review HEP, DN if helpful, trial mckenzie cervical program

## 2024-03-21 ENCOUNTER — Encounter: Payer: Self-pay | Admitting: Physical Therapy

## 2024-03-21 ENCOUNTER — Ambulatory Visit: Admitting: Physical Therapy

## 2024-03-21 DIAGNOSIS — M25511 Pain in right shoulder: Secondary | ICD-10-CM | POA: Diagnosis not present

## 2024-03-21 DIAGNOSIS — M542 Cervicalgia: Secondary | ICD-10-CM

## 2024-03-21 DIAGNOSIS — G8929 Other chronic pain: Secondary | ICD-10-CM

## 2024-03-21 DIAGNOSIS — M6281 Muscle weakness (generalized): Secondary | ICD-10-CM

## 2024-04-01 ENCOUNTER — Other Ambulatory Visit: Payer: Self-pay | Admitting: Emergency Medicine

## 2024-04-09 ENCOUNTER — Encounter: Admitting: Physical Therapy

## 2024-04-09 DIAGNOSIS — M9905 Segmental and somatic dysfunction of pelvic region: Secondary | ICD-10-CM | POA: Diagnosis not present

## 2024-04-09 DIAGNOSIS — M5136 Other intervertebral disc degeneration, lumbar region with discogenic back pain only: Secondary | ICD-10-CM | POA: Diagnosis not present

## 2024-04-09 DIAGNOSIS — M9904 Segmental and somatic dysfunction of sacral region: Secondary | ICD-10-CM | POA: Diagnosis not present

## 2024-04-09 DIAGNOSIS — M9903 Segmental and somatic dysfunction of lumbar region: Secondary | ICD-10-CM | POA: Diagnosis not present

## 2024-05-24 ENCOUNTER — Ambulatory Visit

## 2024-05-24 VITALS — BP 130/80 | HR 52 | Ht 71.0 in | Wt 189.4 lb

## 2024-05-24 DIAGNOSIS — Z0000 Encounter for general adult medical examination without abnormal findings: Secondary | ICD-10-CM | POA: Diagnosis not present

## 2024-05-24 NOTE — Patient Instructions (Signed)
 Shane Phillips , Thank you for taking time out of your busy schedule to complete your Annual Wellness Visit with me. I enjoyed our conversation and look forward to speaking with you again next year. I, as well as your care team,  appreciate your ongoing commitment to your health goals. Please review the following plan we discussed and let me know if I can assist you in the future. Your Game plan/ To Do List   Follow up Visits: Next Medicare AWV with our clinical staff: 05/27/2025.   Have you seen your provider in the last 6 months (3 months if uncontrolled diabetes)? No Next Office Visit with your provider: Last office visit on 10/17/2024.  Patient will call office to schedule yearly  Clinician Recommendations:  Aim for 30 minutes of exercise or brisk walking, 6-8 glasses of water, and 5 servings of fruits and vegetables each day. Keep up the work.        This is a list of the screening recommended for you and due dates:  Health Maintenance  Topic Date Due   Medicare Annual Wellness Visit  Never done   Zoster (Shingles) Vaccine (1 of 2) Never done   COVID-19 Vaccine (4 - 2024-25 season) 08/27/2023   Pneumonia Vaccine (1 of 1 - PCV) 09/27/2024*   Flu Shot  07/26/2024   DTaP/Tdap/Td vaccine (2 - Td or Tdap) 05/27/2025   Colon Cancer Screening  12/16/2027   Hepatitis C Screening  Completed   HPV Vaccine  Aged Out   Meningitis B Vaccine  Aged Out  *Topic was postponed. The date shown is not the original due date.    Advanced directives: (In Chart) A copy of your advanced directives are scanned into your chart should your provider ever need it. Advance Care Planning is important because it:  [x]  Makes sure you receive the medical care that is consistent with your values, goals, and preferences  [x]  It provides guidance to your family and loved ones and reduces their decisional burden about whether or not they are making the right decisions based on your wishes.  Follow the link provided in  your after visit summary or read over the paperwork we have mailed to you to help you started getting your Advance Directives in place. If you need assistance in completing these, please reach out to us  so that we can help you!  See attachments for Preventive Care and Fall Prevention Tips.

## 2024-05-24 NOTE — Progress Notes (Signed)
 Subjective:   Shane Phillips is a 68 y.o. who presents for a Medicare Wellness preventive visit.  As a reminder, Annual Wellness Visits don't include a physical exam, and some assessments may be limited, especially if this visit is performed virtually. We may recommend an in-person follow-up visit with your provider if needed.  Visit Complete: In person  Persons Participating in Visit: Patient.  AWV Questionnaire: Yes: Patient Medicare AWV questionnaire was completed by the patient on 05/23/2024; I have confirmed that all information answered by patient is correct and no changes since this date.  Cardiac Risk Factors include: advanced age (>74men, >68 women);hypertension;dyslipidemia     Objective:     Today's Vitals   05/24/24 1135  Weight: 189 lb 6.4 oz (85.9 kg)  Height: 5\' 11"  (1.803 m)   Body mass index is 26.42 kg/m.     05/24/2024   11:41 AM 03/21/2024    9:19 AM 10/13/2023   10:57 AM 12/15/2017    1:27 PM 12/01/2017    3:34 PM  Advanced Directives  Does Patient Have a Medical Advance Directive? Yes Yes Yes Yes No  Type of Estate agent of [place];Living will Healthcare Power of [employer] Power of [employer] Power of Attorney   Does patient want to make changes to medical advance directive? No - Patient declined  No - Patient declined    Copy of Healthcare Power of Attorney in Chart? Yes - validated most recent copy scanned in chart (See row information)   Yes     Current Medications (verified) Outpatient Encounter Medications as of 05/24/2024  Medication Sig   amLODipine  (NORVASC ) 10 MG tablet Take 1 tablet (10 mg total) by mouth daily.   carbamazepine  (TEGRETOL ) 200 MG tablet TAKE TWO TABLETS BY MOUTH EVERY NIGHT AT BEDTIME   co-enzyme Q-10 30 MG capsule Take 30 mg by mouth daily.   GLUCOSAMINE-CHONDROITIN PO Take by mouth daily.   Omega-3 Fatty Acids (FISH OIL PO) Take by mouth daily.   rosuvastatin  (CRESTOR ) 20 MG tablet  TAKE 1 TABLET BY MOUTH DAILY   tiZANidine  (ZANAFLEX ) 4 MG tablet Take 1 tablet (4 mg total) by mouth at bedtime as needed for muscle spasms.   No facility-administered encounter medications on file as of 05/24/2024.    Allergies (verified) Patient has no known allergies.   History: Past Medical History:  Diagnosis Date   Arthritis    hands    High cholesterol    Hypertension    Seizure (HCC)    last sz 2001   Past Surgical History:  Procedure Laterality Date   COLONOSCOPY  10/15/2007   None     Family History  Problem Relation Age of Onset   Leukemia Mother    Cancer Mother    Heart attack Brother        2 in 6 months    Hyperlipidemia Brother    Cancer Sister        breast cancer   Hyperlipidemia Sister    Breast cancer Sister    Cancer Father        bladder cancer   Cancer Maternal Grandfather        lung cancer   Cancer Paternal Grandfather        melanoma   Hyperlipidemia Sister    Colon cancer Neg Hx    Colon polyps Neg Hx    Esophageal cancer Neg Hx    Rectal cancer Neg Hx    Stomach cancer Neg Hx  Social History   Socioeconomic History   Marital status: Married    Spouse name: Ammon Bales   Number of children: Not on file   Years of education: Not on file   Highest education level: 12th grade  Occupational History   Occupation: Self Empolyed  Tobacco Use   Smoking status: Never   Smokeless tobacco: Never  Vaping Use   Vaping status: Never Used  Substance and Sexual Activity   Alcohol use: No   Drug use: Yes    Types: "Crack" cocaine   Sexual activity: Yes  Other Topics Concern   Not on file  Social History Narrative   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   Patient is Married to [place]. And has 4 cats      Social Drivers of Corporate investment banker Strain: Low Risk  (05/23/2024)   Overall Financial Resource Strain (CARDIA)    Difficulty of Paying Living Expenses: Not very hard  Food Insecurity: No Food Insecurity (05/23/2024)    Hunger Vital Sign    Worried About Running Out of Food in the Last Year: Never true    Ran Out of Food in the Last Year: Never true  Transportation Needs: No Transportation Needs (05/23/2024)   PRAPARE - Administrator, Civil Service (Medical): No    Lack of Transportation (Non-Medical): No  Physical Activity: Sufficiently Active (05/23/2024)   Exercise Vital Sign    Days of Exercise per Week: 6 days    Minutes of Exercise per Session: 120 min  Stress: No Stress Concern Present (05/23/2024)   [employer] of Occupational Health - Occupational Stress Questionnaire    Feeling of Stress : Not at all  Social Connections: Socially Integrated (05/23/2024)   Social Connection and Isolation Panel [NHANES]    Frequency of Communication with Friends and Family: More than three times a week    Frequency of Social Gatherings with Friends and Family: Three times a week    Attends Religious Services: More than 4 times per year    Active Member of Clubs or Organizations: Yes    Attends Engineer, structural: More than 4 times per year    Marital Status: Married    Tobacco Counseling Counseling given: Not Answered    Clinical Intake:  Pre-visit preparation completed: Yes  Pain : No/denies pain     BMI - recorded: 26.42 Nutritional Status: BMI 25 -29 Overweight Nutritional Risks: Nausea/ vomitting/ diarrhea (last week diarrhea) Diabetes: No  Lab Results  Component Value Date   HGBA1C 5.8 09/04/2023   HGBA1C 5.9 08/22/2022   HGBA1C 5.9 05/27/2021     How often do you need to have someone help you when you read instructions, pamphlets, or other written materials from your doctor or pharmacy?: 1 - Never     Information entered by :: Lavergne Hiltunen, RMA   Activities of Daily Living     05/23/2024    9:42 AM  In your present state of health, do you have any difficulty performing the following activities:  Hearing? 0  Vision? 0  Difficulty concentrating or  making decisions? 0  Walking or climbing stairs? 0  Dressing or bathing? 0  Doing errands, shopping? 0  Preparing Food and eating ? N  Using the Toilet? N  In the past six months, have you accidently leaked urine? N  Do you have problems with loss of bowel control? N  Managing your Medications? N  Managing your Finances? N  Housekeeping  or managing your Housekeeping? N    Patient Care Team: Elvira Hammersmith, MD as PCP - General (Internal Medicine)  Indicate any recent Medical Services you may have received from other than [facility] providers in the past year (date may be approximate).     Assessment:    This is a routine wellness examination for Brandin.  Hearing/Vision screen Hearing Screening - Comments:: Has some hearing loss-per pt Vision Screening - Comments:: Wears eyeglasses/ [place]    Goals Addressed               This Visit's Progress     Patient Stated (pt-stated)        Stay healthy/2025       Depression Screen     05/24/2024   11:41 AM 09/28/2023    2:10 PM 09/04/2023    8:26 AM 08/22/2022    9:21 AM 06/16/2021    2:09 PM 12/01/2020    2:41 PM 05/28/2020    9:48 AM  PHQ 2/9 Scores  PHQ - 2 Score 0 0 0 0 0 0 0  PHQ- 9 Score 0          Fall Risk     05/23/2024    9:42 AM 09/28/2023    2:09 PM 09/04/2023    8:26 AM 08/22/2022    9:21 AM 06/16/2021    2:09 PM  Fall Risk   Falls in the past year? 1 1 0 0 1  Number falls in past yr: 0 0 0 0 0  Injury with Fall? 1 1 0 0 0  Risk for fall due to : Impaired balance/gait Impaired balance/gait Impaired vision No Fall Risks No Fall Risks  Follow up Falls evaluation completed;Falls prevention discussed Falls evaluation completed Falls evaluation completed Falls evaluation completed Falls evaluation completed    MEDICARE RISK AT HOME:  Medicare Risk at Home Any stairs in or around the home?: (Patient-Rptd) Yes If so, are there any without handrails?: (Patient-Rptd) No Home free of loose throw rugs  in walkways, pet beds, electrical cords, etc?: (Patient-Rptd) Yes Adequate lighting in your home to reduce risk of falls?: (Patient-Rptd) Yes Life alert?: (Patient-Rptd) No Use of a cane, walker or w/c?: (Patient-Rptd) No Grab bars in the bathroom?: (Patient-Rptd) No Shower chair or bench in shower?: (Patient-Rptd) No Elevated toilet seat or a handicapped toilet?: (Patient-Rptd) Yes  TIMED UP AND GO:  Was the test performed?  Yes  Length of time to ambulate 10 feet: 10 sec Gait steady and fast without use of assistive device  Cognitive Function: Declined/Normal: No cognitive concerns noted by patient or family. Patient alert, oriented, able to answer questions appropriately and recall recent events. No signs of memory loss or confusion.        Immunizations Immunization History  Administered Date(s) Administered   PFIZER(Purple Top)SARS-COV-2 Vaccination 03/20/2020, 04/14/2020   Pfizer Covid-19 Vaccine Bivalent Booster 15yrs & up 03/04/2022   Tdap 05/28/2015    Screening Tests Health Maintenance  Topic Date Due   Zoster Vaccines- Shingrix (1 of 2) Never done   COVID-19 Vaccine (4 - 2024-25 season) 08/27/2023   Pneumonia Vaccine 15+ Years old (1 of 1 - PCV) 09/27/2024 (Originally 03/11/2007)   INFLUENZA VACCINE  07/26/2024   Medicare Annual Wellness (AWV)  05/24/2025   DTaP/Tdap/Td (2 - Td or Tdap) 05/27/2025   Colonoscopy  12/16/2027   Hepatitis C Screening  Completed   HPV VACCINES  Aged Out   Meningococcal B Vaccine  Aged Out  Health Maintenance  Health Maintenance Due  Topic Date Due   Zoster Vaccines- Shingrix (1 of 2) Never done   COVID-19 Vaccine (4 - 2024-25 season) 08/27/2023   Health Maintenance Items Addressed: See Nurse Notes  Additional Screening:  Vision Screening: Recommended annual ophthalmology exams for early detection of glaucoma and other disorders of the eye.  Dental Screening: Recommended annual dental exams for proper oral  hygiene  Community Resource Referral / Chronic Care Management: CRR required this visit?  No   CCM required this visit?  No   Plan:    I have personally reviewed and noted the following in the patient's chart:   Medical and social history Use of alcohol, tobacco or illicit drugs  Current medications and supplements including opioid prescriptions. Patient is not currently taking opioid prescriptions. Functional ability and status Nutritional status Physical activity Advanced directives List of other physicians Hospitalizations, surgeries, and ER visits in previous 12 months Vitals Screenings to include cognitive, depression, and falls Referrals and appointments  In addition, I have reviewed and discussed with patient certain preventive protocols, quality metrics, and best practice recommendations. A written personalized care plan for preventive services as well as general preventive health recommendations were provided to patient.   Bernadean Saling L Chaela Branscum, CMA   05/24/2024   After Visit Summary: (MyChart) Due to this being a telephonic visit, the after visit summary with patients personalized plan was offered to patient via MyChart   Notes: Please refer to Routing Comments.

## 2024-07-31 ENCOUNTER — Encounter (HOSPITAL_COMMUNITY): Payer: Self-pay

## 2024-07-31 ENCOUNTER — Ambulatory Visit (HOSPITAL_COMMUNITY)
Admission: EM | Admit: 2024-07-31 | Discharge: 2024-07-31 | Disposition: A | Discharge status: Home / Self Care | Attending: Family Medicine | Admitting: Family Medicine

## 2024-07-31 DIAGNOSIS — L089 Local infection of the skin and subcutaneous tissue, unspecified: Secondary | ICD-10-CM | POA: Diagnosis not present

## 2024-07-31 DIAGNOSIS — B9689 Other specified bacterial agents as the cause of diseases classified elsewhere: Secondary | ICD-10-CM

## 2024-07-31 MED ORDER — DOXYCYCLINE MONOHYDRATE 100 MG PO CAPS: 100.0000 mg | ORAL_CAPSULE | Freq: Two times a day (BID) | ORAL | 0 refills | Status: DC

## 2024-07-31 NOTE — ED Triage Notes (Signed)
 Pt c/o abscess to top of buttocks with redness and white head x2 wks and now very sensitive. Denies using warm compresses or med for sx's.

## 2024-07-31 NOTE — ED Provider Notes (Signed)
 [facility]   [id number] 07/31/24 Arrival Time: 1022  ASSESSMENT & PLAN:  1. Localized bacterial skin infection    Declines I&D today. Prefers trial of antibiotic first. I think this is reasonable.  Trial of: Meds ordered this encounter  Medications   doxycycline  (MONODOX ) 100 MG capsule    Sig: Take 1 capsule (100 mg total) by mouth 2 (two) times daily.    Dispense:  14 capsule    Refill:  0    Will follow up with PCP or here if worsening or failing to improve as anticipated. Reviewed expectations re: course of current medical issues. Questions answered. Outlined signs and symptoms indicating need for more acute intervention. Patient verbalized understanding. After Visit Summary given.   SUBJECTIVE:  Shane Phillips is a 68 y.o. male who presents with a skin complaint. Pt c/o possible infected area to top of buttocks with redness and white head; has always had a small bump there; x2 wks and now very sensitive. Denies using warm compresses or med for sx's. But reports the size had decreased over past few days. Denies fever.   OBJECTIVE: Vitals:   07/31/24 1043  BP: (!) 155/96  Pulse: 60  Resp: 18  Temp: 98.3 F (36.8 C)  TempSrc: Oral  SpO2: 98%    General appearance: alert; no distress Extremities: no edema; moves all extremities normally Skin: warm and dry; approx 1 cm erythematous induration just above superior gluteal cleft; firm to touch; without drainage/bleeding Psychological: alert and cooperative; normal mood and affect  No Known Allergies  Past Medical History:  Diagnosis Date   Arthritis    hands    High cholesterol    Hypertension    Seizure (HCC)    last sz 2001   Social History   Socioeconomic History   Marital status: Married    Spouse name: Verneita   Number of children: Not on file   Years of education: Not on file   Highest education level: 12th grade  Occupational History   Occupation: Self Empolyed  Tobacco Use   Smoking  status: Never   Smokeless tobacco: Never  Vaping Use   Vaping status: Never Used  Substance and Sexual Activity   Alcohol use: No   Drug use: Not Currently    Types: Crack cocaine   Sexual activity: Yes  Other Topics Concern   Not on file  Social History Narrative   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   Patient is Married to [place]. And has 4 cats      Social Drivers of Corporate investment banker Strain: Low Risk  (05/23/2024)   Overall Financial Resource Strain (CARDIA)    Difficulty of Paying Living Expenses: Not very hard  Food Insecurity: No Food Insecurity (05/23/2024)   Hunger Vital Sign    Worried About Running Out of Food in the Last Year: Never true    Ran Out of Food in the Last Year: Never true  Transportation Needs: No Transportation Needs (05/23/2024)   PRAPARE - Administrator, Civil Service (Medical): No    Lack of Transportation (Non-Medical): No  Physical Activity: Sufficiently Active (05/23/2024)   Exercise Vital Sign    Days of Exercise per Week: 6 days    Minutes of Exercise per Session: 120 min  Stress: No Stress Concern Present (05/23/2024)   [employer] of Occupational Health - Occupational Stress Questionnaire    Feeling of Stress : Not at all  Social Connections: Socially Integrated (  05/23/2024)   Social Connection and Isolation Panel    Frequency of Communication with Friends and Family: More than three times a week    Frequency of Social Gatherings with Friends and Family: Three times a week    Attends Religious Services: More than 4 times per year    Active Member of Clubs or Organizations: Yes    Attends Banker Meetings: More than 4 times per year    Marital Status: Married  Catering manager Violence: Not At Risk (05/24/2024)   Humiliation, Afraid, Rape, and Kick questionnaire    Fear of Current or Ex-Partner: No    Emotionally Abused: No    Physically Abused: No    Sexually Abused: No   Family History   Problem Relation Age of Onset   Leukemia Mother    Cancer Mother    Heart attack Brother        2 in 6 months    Hyperlipidemia Brother    Cancer Sister        breast cancer   Hyperlipidemia Sister    Breast cancer Sister    Cancer Father        bladder cancer   Cancer Maternal Grandfather        lung cancer   Cancer Paternal Grandfather        melanoma   Hyperlipidemia Sister    Colon cancer Neg Hx    Colon polyps Neg Hx    Esophageal cancer Neg Hx    Rectal cancer Neg Hx    Stomach cancer Neg Hx    Past Surgical History:  Procedure Laterality Date   COLONOSCOPY  10/15/2007   None        Rolinda Rogue, MD 07/31/24 1110

## 2024-10-11 ENCOUNTER — Encounter (HOSPITAL_COMMUNITY): Payer: Self-pay

## 2024-10-11 ENCOUNTER — Ambulatory Visit (HOSPITAL_COMMUNITY)
Admission: EM | Admit: 2024-10-11 | Discharge: 2024-10-11 | Disposition: A | Discharge status: Home / Self Care | Attending: Physician Assistant | Admitting: Physician Assistant

## 2024-10-11 DIAGNOSIS — L239 Allergic contact dermatitis, unspecified cause: Secondary | ICD-10-CM

## 2024-10-11 MED ORDER — CETIRIZINE HCL 5 MG PO TABS: 5.0000 mg | ORAL_TABLET | Freq: Every day | ORAL | 0 refills | Status: DC

## 2024-10-11 MED ORDER — TRIAMCINOLONE ACETONIDE 0.1 % EX CREA: 1.0000 | TOPICAL_CREAM | Freq: Two times a day (BID) | CUTANEOUS | 0 refills | Status: DC

## 2024-10-11 NOTE — ED Triage Notes (Signed)
 Pt c/o red raised itchy rash to rt arm x1wk after working in the yard. States used hydrocortisone cream with no relief.

## 2024-10-11 NOTE — ED Provider Notes (Signed)
 [facility]    CSN: [id number] Arrival date & time: 10/11/24  1226      History   Chief Complaint Chief Complaint  Patient presents with   Rash    HPI Shane Phillips is a 68 y.o. male.   Patient presents with a weeklong history of rash in his right medial antecubital space.  He reports that symptoms began after he was working outside but does not believe that he was exposed to any poison ivy or poison oak.  He denies any changes to personal hygiene products including soaps or detergents.  Denies any recent medication changes.  He denies episodes of similar symptoms in the past.  Denies any exposure to new plants, insects, animals or any dietary changes.  He has tried topical hydrocortisone cream without improvement of symptoms.  He reports the area is intensely pruritic but denies any associated pain.  There has been no spread since onset.  Denies any fever, nausea, vomiting, swelling of throat, shortness of breath.  Denies history of dermatological condition.    Past Medical History:  Diagnosis Date   Arthritis    hands    High cholesterol    Hypertension    Seizure (HCC)    last sz 2001    Patient Active Problem List   Diagnosis Date Noted   Acute pain of right shoulder 09/28/2023   Injury of right shoulder 09/28/2023   Dyslipidemia 11/19/2019   Hyperlipidemia 08/19/2019   Essential hypertension 09/05/2017   Localization-related focal epilepsy with simple partial seizures (HCC) 08/05/2013   Generalized convulsive epilepsy (HCC) 08/05/2013    Past Surgical History:  Procedure Laterality Date   COLONOSCOPY  10/15/2007   None         Home Medications    Prior to Admission medications   Medication Sig Start Date End Date Taking? Authorizing Provider  cetirizine (ZYRTEC) 5 MG tablet Take 1 tablet (5 mg total) by mouth daily. 10/11/24  Yes Nala Kachel K, PA-C  triamcinolone cream (KENALOG) 0.1 % Apply 1 Application topically 2 (two) times daily.  10/11/24  Yes Lanetra Hartley K, PA-C  amLODipine  (NORVASC ) 10 MG tablet Take 1 tablet (10 mg total) by mouth daily. 09/04/23   Sagardia, Miguel Jose, MD  carbamazepine  (TEGRETOL ) 200 MG tablet TAKE TWO TABLETS BY MOUTH EVERY NIGHT AT BEDTIME 01/16/24   Purcell Emil Schanz, MD  co-enzyme Q-10 30 MG capsule Take 30 mg by mouth daily.    [provider]  GLUCOSAMINE-CHONDROITIN PO Take by mouth daily.    [provider]  Omega-3 Fatty Acids (FISH OIL PO) Take by mouth daily.    [provider]  rosuvastatin  (CRESTOR ) 20 MG tablet TAKE 1 TABLET BY MOUTH DAILY 04/01/24   Purcell Emil Schanz, MD    Family History Family History  Problem Relation Age of Onset   Leukemia Mother    Cancer Mother    Heart attack Brother        2 in 6 months    Hyperlipidemia Brother    Cancer Sister        breast cancer   Hyperlipidemia Sister    Breast cancer Sister    Cancer Father        bladder cancer   Cancer Maternal Grandfather        lung cancer   Cancer Paternal Grandfather        melanoma   Hyperlipidemia Sister    Colon cancer Neg Hx    Colon polyps Neg  Hx    Esophageal cancer Neg Hx    Rectal cancer Neg Hx    Stomach cancer Neg Hx     Social History Social History   Tobacco Use   Smoking status: Never   Smokeless tobacco: Never  Vaping Use   Vaping status: Never Used  Substance Use Topics   Alcohol use: No   Drug use: Not Currently    Types: Crack cocaine     Allergies   Patient has no known allergies.   Review of Systems Review of Systems  Constitutional:  Negative for activity change, appetite change, fatigue and fever.  HENT:  Negative for trouble swallowing and voice change.   Respiratory:  Negative for shortness of breath.   Gastrointestinal:  Negative for nausea and vomiting.  Skin:  Positive for rash.     Physical Exam Triage Vital Signs ED Triage Vitals  Encounter Vitals Group     BP 10/11/24 1253 (!) 152/85     Girls Systolic BP  Percentile --      Girls Diastolic BP Percentile --      Boys Systolic BP Percentile --      Boys Diastolic BP Percentile --      Pulse Rate 10/11/24 1253 (!) 55     Resp 10/11/24 1253 18     Temp 10/11/24 1253 98.1 F (36.7 C)     Temp Source 10/11/24 1253 Oral     SpO2 10/11/24 1253 95 %     Weight --      Height --      Head Circumference --      Peak Flow --      Pain Score 10/11/24 1252 0     Pain Loc --      Pain Education --      Exclude from Growth Chart --    No data found.  Updated Vital Signs BP (!) 152/85 (BP Location: Left Arm)   Pulse (!) 55   Temp 98.1 F (36.7 C) (Oral)   Resp 18   SpO2 95%   Visual Acuity Right Eye Distance:   Left Eye Distance:   Bilateral Distance:    Right Eye Near:   Left Eye Near:    Bilateral Near:     Physical Exam Vitals reviewed.  Constitutional:      General: He is awake.     Appearance: Normal appearance. He is well-developed. He is not ill-appearing.     Comments: Very pleasant male appears stated age in no acute distress sitting comfortably in exam room  HENT:     Head: Normocephalic and atraumatic.  Cardiovascular:     Rate and Rhythm: Normal rate and regular rhythm.     Heart sounds: Normal heart sounds, S1 normal and S2 normal. No murmur heard. Pulmonary:     Effort: Pulmonary effort is normal.     Breath sounds: Normal breath sounds. No stridor. No wheezing, rhonchi or rales.     Comments: Clear to auscultation bilaterally Skin:    Findings: Rash present. Rash is urticarial and vesicular.         Comments: 7 cm x 3 cm erythematous blanchable rash noted right medial antecubital space with scattered fine vesicles and urticarial appearance.  No streaking or evidence of lymphangitis.  No bleeding or drainage noted.  Neurological:     Mental Status: He is alert.  Psychiatric:        Behavior: Behavior is cooperative.      UC  Treatments / Results  Labs (all labs ordered are listed, but only abnormal  results are displayed) Labs Reviewed - No data to display  EKG   Radiology No results found.  Procedures Procedures (including critical care time)  Medications Ordered in UC Medications - No data to display  Initial Impression / Assessment and Plan / UC Course  I have reviewed the triage vital signs and the nursing notes.  Pertinent labs & imaging results that were available during my care of the patient were reviewed by me and considered in my medical decision making (see chart for details).     Patient is well-appearing, afebrile, nontoxic, nontachycardic.  Suspect contact dermatitis as etiology of symptoms given clinical presentation.  Low suspicion for cellulitis or infectious etiology as symptoms are pruritic and not painful, rash has not spread, he has not had any systemic symptoms.  Given localized symptoms will treat with topical steroids and he was prescribed triamcinolone 0.1% to be applied twice daily.  Recommended hypoallergenic soaps and detergents and avoid any irritants.  He was started on cetirizine to help with pruritus 5 mg at night.  Discussed he is not to combine this with additional antihistamines due to concern for anticholinergic side effects.  No indication for dose adjustment based on metabolic panel from 09/04/2023 with a creatinine of 1.12 and calculated creatinine clearance of 78 mL per minute.  We discussed that if his symptoms are not improving within a few days he should be reevaluated.  If anything worsens and he has rapid spread of rash, swelling of his throat, shortness of breath, fever, nausea, vomiting he needs to be seen immediately.  Strict return precautions given.  All questions answered to patient satisfaction.  Final Clinical Impressions(s) / UC Diagnoses   Final diagnoses:  Allergic contact dermatitis, unspecified trigger     Discharge Instructions      take cetirizine daily to help with the itching.  Do not take additional antihistamines  with this medication including Allegra/fexofenadine, loratadine/Claritin, diphenhydramine/Benadryl as this increases the risk of side effects.  Keep the area clean with soap and water.  Apply triamcinolone twice daily.  Use hypoallergenic soaps and detergents and avoid anything that could be irritating.  If this is not getting better or if anything worsens and it spreads, you develop fever, nausea, vomiting, swelling of your throat, shortness of breath need to be seen immediately.     ED Prescriptions     Medication Sig Dispense Auth. Provider   triamcinolone cream (KENALOG) 0.1 % Apply 1 Application topically 2 (two) times daily. 30 g Jayen Bromwell K, PA-C   cetirizine (ZYRTEC) 5 MG tablet Take 1 tablet (5 mg total) by mouth daily. 10 tablet Atley Scarboro K, PA-C      PDMP not reviewed this encounter.   Sherrell Rocky POUR, PA-C 10/11/24 1316

## 2024-10-11 NOTE — Discharge Instructions (Addendum)
 take cetirizine daily to help with the itching.  Do not take additional antihistamines with this medication including Allegra/fexofenadine, loratadine/Claritin, diphenhydramine/Benadryl as this increases the risk of side effects.  Keep the area clean with soap and water.  Apply triamcinolone twice daily.  Use hypoallergenic soaps and detergents and avoid anything that could be irritating.  If this is not getting better or if anything worsens and it spreads, you develop fever, nausea, vomiting, swelling of your throat, shortness of breath need to be seen immediately.

## 2024-11-13 ENCOUNTER — Other Ambulatory Visit: Payer: Self-pay | Admitting: Emergency Medicine

## 2024-11-13 DIAGNOSIS — I10 Essential (primary) hypertension: Secondary | ICD-10-CM

## 2024-11-14 ENCOUNTER — Other Ambulatory Visit: Payer: Self-pay | Admitting: Emergency Medicine

## 2024-11-14 DIAGNOSIS — I10 Essential (primary) hypertension: Secondary | ICD-10-CM

## 2024-11-14 NOTE — Telephone Encounter (Signed)
 Copied from CRM [id number]. Topic: Clinical - Medication Refill >> Nov 14, 2024  4:36 PM Shereese L wrote: Medication: amLODipine  (NORVASC ) 10 MG tablet  Has the patient contacted their pharmacy? Yes (Agent: If no, request that the patient contact the pharmacy for the refill. If patient does not wish to contact the pharmacy document the reason why and proceed with request.) (Agent: If yes, when and what did the pharmacy advise?)  This is the patient's preferred pharmacy:  [facility] [id number] - [place], [place] - [address] [place] [address] Phone: [phone] Fax: [phone]  Is this the correct pharmacy for this prescription? Yes If no, delete pharmacy and type the correct one.   Has the prescription been filled recently? Yes  Is the patient out of the medication? Yes  Has the patient been seen for an appointment in the last year OR does the patient have an upcoming appointment? Yes  Can we respond through MyChart? Yes  Agent: Please be advised that Rx refills may take up to 3 business days. We ask that you follow-up with your pharmacy.

## 2024-11-15 MED ORDER — AMLODIPINE BESYLATE 10 MG PO TABS: 10.0000 mg | ORAL_TABLET | Freq: Every day | ORAL | 3 refills | Status: AC

## 2024-11-19 ENCOUNTER — Encounter: Admitting: Emergency Medicine

## 2024-11-25 ENCOUNTER — Encounter: Admitting: Emergency Medicine

## 2024-11-28 ENCOUNTER — Ambulatory Visit: Admitting: Emergency Medicine

## 2024-11-28 ENCOUNTER — Encounter: Payer: Self-pay | Admitting: Emergency Medicine

## 2024-11-28 ENCOUNTER — Ambulatory Visit: Payer: Self-pay | Admitting: Emergency Medicine

## 2024-11-28 VITALS — BP 120/70 | HR 66 | Temp 97.7°F | Ht 71.0 in | Wt 197.0 lb

## 2024-11-28 DIAGNOSIS — Z0000 Encounter for general adult medical examination without abnormal findings: Secondary | ICD-10-CM

## 2024-11-28 DIAGNOSIS — Z125 Encounter for screening for malignant neoplasm of prostate: Secondary | ICD-10-CM

## 2024-11-28 DIAGNOSIS — G40309 Generalized idiopathic epilepsy and epileptic syndromes, not intractable, without status epilepticus: Secondary | ICD-10-CM

## 2024-11-28 DIAGNOSIS — Z1329 Encounter for screening for other suspected endocrine disorder: Secondary | ICD-10-CM

## 2024-11-28 DIAGNOSIS — Z130 Encounter for screening for diseases of the blood and blood-forming organs and certain disorders involving the immune mechanism: Secondary | ICD-10-CM | POA: Diagnosis not present

## 2024-11-28 DIAGNOSIS — E785 Hyperlipidemia, unspecified: Secondary | ICD-10-CM

## 2024-11-28 DIAGNOSIS — Z13228 Encounter for screening for other metabolic disorders: Secondary | ICD-10-CM | POA: Diagnosis not present

## 2024-11-28 DIAGNOSIS — I10 Essential (primary) hypertension: Secondary | ICD-10-CM

## 2024-11-28 DIAGNOSIS — Z0001 Encounter for general adult medical examination with abnormal findings: Secondary | ICD-10-CM

## 2024-11-28 DIAGNOSIS — K2100 Gastro-esophageal reflux disease with esophagitis, without bleeding: Secondary | ICD-10-CM | POA: Diagnosis not present

## 2024-11-28 LAB — CBC WITH DIFFERENTIAL/PLATELET
Basophils Absolute: 0 K/uL (ref 0.0–0.1)
Basophils Relative: 0.3 % (ref 0.0–3.0)
Eosinophils Absolute: 0.2 K/uL (ref 0.0–0.7)
Eosinophils Relative: 5 % (ref 0.0–5.0)
HCT: 40.1 % (ref 39.0–52.0)
Hemoglobin: 13.9 g/dL (ref 13.0–17.0)
Lymphocytes Relative: 34.3 % (ref 12.0–46.0)
Lymphs Abs: 1.6 K/uL (ref 0.7–4.0)
MCHC: 34.6 g/dL (ref 30.0–36.0)
MCV: 90.7 fl (ref 78.0–100.0)
Monocytes Absolute: 0.4 K/uL (ref 0.1–1.0)
Monocytes Relative: 9.5 % (ref 3.0–12.0)
Neutro Abs: 2.3 K/uL (ref 1.4–7.7)
Neutrophils Relative %: 50.9 % (ref 43.0–77.0)
Platelets: 188 K/uL (ref 150.0–400.0)
RBC: 4.42 Mil/uL (ref 4.22–5.81)
RDW: 13.2 % (ref 11.5–15.5)
WBC: 4.6 K/uL (ref 4.0–10.5)

## 2024-11-28 LAB — COMPREHENSIVE METABOLIC PANEL WITH GFR
ALT: 19 U/L (ref 0–53)
AST: 17 U/L (ref 0–37)
Albumin: 4.5 g/dL (ref 3.5–5.2)
Alkaline Phosphatase: 75 U/L (ref 39–117)
BUN: 19 mg/dL (ref 6–23)
CO2: 30 meq/L (ref 19–32)
Calcium: 9.3 mg/dL (ref 8.4–10.5)
Chloride: 103 meq/L (ref 96–112)
Creatinine, Ser: 0.83 mg/dL (ref 0.40–1.50)
GFR: 90.43 mL/min (ref >60.00)
Glucose, Bld: 89 mg/dL (ref 70–99)
Potassium: 4.1 meq/L (ref 3.5–5.1)
Sodium: 140 meq/L (ref 135–145)
Total Bilirubin: 0.5 mg/dL (ref 0.2–1.2)
Total Protein: 6.9 g/dL (ref 6.0–8.3)

## 2024-11-28 LAB — LIPID PANEL
Cholesterol: 185 mg/dL (ref 0–200)
HDL: 55.7 mg/dL (ref >39.00)
LDL Cholesterol: 114 mg/dL — ABNORMAL HIGH (ref 0–99)
NonHDL: 129.05
Total CHOL/HDL Ratio: 3
Triglycerides: 77 mg/dL (ref 0.0–149.0)
VLDL: 15.4 mg/dL (ref 0.0–40.0)

## 2024-11-28 LAB — PSA: PSA: 2.42 ng/mL (ref 0.10–4.00)

## 2024-11-28 LAB — HEMOGLOBIN A1C: Hgb A1c MFr Bld: 5.6 % (ref 4.6–6.5)

## 2024-11-28 MED ORDER — PANTOPRAZOLE SODIUM 40 MG PO TBEC: 40.0000 mg | DELAYED_RELEASE_TABLET | Freq: Every day | ORAL | 3 refills | Status: AC

## 2024-11-28 NOTE — Assessment & Plan Note (Signed)
 BP Readings from Last 3 Encounters:  11/28/24 (!) 140/80  10/11/24 (!) 152/85  07/31/24 (!) 155/96  Elevated blood pressure readings in the office but normal at home Cardiovascular risks associated with hypertension discussed Dietary approaches to stop hypertension discussed Recommend to continue amlodipine  10 mg daily

## 2024-11-28 NOTE — Assessment & Plan Note (Signed)
 Stable chronic condition Diet and nutrition discussed Lipid profile done today Continue rosuvastatin  20 mg daily.  The 10-year ASCVD risk score (Arnett DK, et al., 2019) is: 13.4%   Values used to calculate the score:     Age: 68 years     Clincally relevant sex: Male     Is Non-Hispanic African American: No     Diabetic: No     Tobacco smoker: No     Systolic Blood Pressure: 120 mmHg     Is BP treated: Yes     HDL Cholesterol: 63.8 mg/dL     Total Cholesterol: 198 mg/dL

## 2024-11-28 NOTE — Progress Notes (Signed)
 Shane Phillips 68 y.o.   Chief Complaint  Patient presents with   Annual Exam    Pt states that he may be having acid reflux, pt notice that after eating spicy food it he notice it     HISTORY OF PRESENT ILLNESS: This is a 68 y.o. male A1A here for annual exam and follow-up of chronic medical conditions including hypertension Also complaining of heartburn and acid reflux mostly when he eats spicy food but also when bending over while working No other complaints or medical concerns today.  HPI   Prior to Admission medications   Medication Sig Start Date End Date Taking? Authorizing Provider  amLODipine  (NORVASC ) 10 MG tablet Take 1 tablet (10 mg total) by mouth daily. 11/15/24  Yes Errin Whitelaw, Emil Schanz, MD  carbamazepine  (TEGRETOL ) 200 MG tablet TAKE TWO TABLETS BY MOUTH EVERY NIGHT AT BEDTIME 01/16/24  Yes Conley Pawling, Emil Schanz, MD  co-enzyme Q-10 30 MG capsule Take 30 mg by mouth daily.   Yes [provider]  GLUCOSAMINE-CHONDROITIN PO Take by mouth daily.   Yes [provider]  Omega-3 Fatty Acids (FISH OIL PO) Take by mouth daily.   Yes [provider]  rosuvastatin  (CRESTOR ) 20 MG tablet TAKE 1 TABLET BY MOUTH DAILY 04/01/24  Yes Purcell Emil Schanz, MD    No Known Allergies  Patient Active Problem List   Diagnosis Date Noted   Dyslipidemia 11/19/2019   Essential hypertension 09/05/2017   Localization-related focal epilepsy with simple partial seizures (HCC) 08/05/2013   Generalized convulsive epilepsy (HCC) 08/05/2013    Past Medical History:  Diagnosis Date   Arthritis    hands    High cholesterol    Hypertension    Seizure (HCC)    last sz 2001    Past Surgical History:  Procedure Laterality Date   COLONOSCOPY  10/15/2007   None      Social History   Socioeconomic History   Marital status: Married    Spouse name: Verneita   Number of children: Not on file   Years of education: Not on file   Highest education level: 12th grade   Occupational History   Occupation: Self Empolyed  Tobacco Use   Smoking status: Never   Smokeless tobacco: Never  Vaping Use   Vaping status: Never Used  Substance and Sexual Activity   Alcohol use: No   Drug use: Not Currently    Types: Crack cocaine   Sexual activity: Yes  Other Topics Concern   Not on file  Social History Narrative   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   Patient is Married to [place]. And has 4 cats      Social Drivers of Corporate Investment Banker Strain: Low Risk  (11/15/2024)   Overall Financial Resource Strain (CARDIA)    Difficulty of Paying Living Expenses: Not very hard  Food Insecurity: No Food Insecurity (11/15/2024)   Hunger Vital Sign    Worried About Running Out of Food in the Last Year: Never true    Ran Out of Food in the Last Year: Never true  Transportation Needs: No Transportation Needs (11/15/2024)   PRAPARE - Administrator, Civil Service (Medical): No    Lack of Transportation (Non-Medical): No  Physical Activity: Sufficiently Active (05/23/2024)   Exercise Vital Sign    Days of Exercise per Week: 6 days    Minutes of Exercise per Session: 120 min  Stress: No Stress Concern Present (11/15/2024)   [place]  [employer] - Occupational Stress Questionnaire    Feeling of Stress: Only a little  Social Connections: Socially Integrated (11/15/2024)   Social Connection and Isolation Panel    Frequency of Communication with Friends and Family: More than three times a week    Frequency of Social Gatherings with Friends and Family: More than three times a week    Attends Religious Services: More than 4 times per year    Active Member of [employer] or Organizations: Yes    Attends Engineer, Structural: More than 4 times per year    Marital Status: Married  Catering Manager Violence: Not At Risk (05/24/2024)   Humiliation, Afraid, Rape, and Kick questionnaire    Fear of Current or Ex-Partner: No     Emotionally Abused: No    Physically Abused: No    Sexually Abused: No    Family History  Problem Relation Age of Onset   Leukemia Mother    Cancer Mother    Heart attack Brother        2 in 6 months    Hyperlipidemia Brother    Cancer Sister        breast cancer   Hyperlipidemia Sister    Breast cancer Sister    Cancer Father        bladder cancer   Cancer Maternal Grandfather        lung cancer   Cancer Paternal Grandfather        melanoma   Hyperlipidemia Sister    Colon cancer Neg Hx    Colon polyps Neg Hx    Esophageal cancer Neg Hx    Rectal cancer Neg Hx    Stomach cancer Neg Hx      Review of Systems  Constitutional: Negative.  Negative for chills and fever.  HENT: Negative.  Negative for congestion and sore throat.   Respiratory: Negative.  Negative for cough and shortness of breath.   Cardiovascular: Negative.  Negative for chest pain and palpitations.  Gastrointestinal:  Positive for heartburn. Negative for abdominal pain, diarrhea, nausea and vomiting.  Genitourinary: Negative.  Negative for dysuria and hematuria.  Skin: Negative.  Negative for rash.  Neurological: Negative.  Negative for dizziness and headaches.  All other systems reviewed and are negative.   Vitals:   11/28/24 1027  BP: (!) 140/80  Pulse: 66  Temp: 97.7 F (36.5 C)  SpO2: 96%    Physical Exam Vitals reviewed.  Constitutional:      Appearance: Normal appearance.  HENT:     Head: Normocephalic.     Right Ear: Tympanic membrane, ear canal and external ear normal.     Left Ear: Tympanic membrane, ear canal and external ear normal.     Mouth/Throat:     Mouth: Mucous membranes are moist.     Pharynx: Oropharynx is clear.  Eyes:     Extraocular Movements: Extraocular movements intact.     Conjunctiva/sclera: Conjunctivae normal.     Pupils: Pupils are equal, round, and reactive to light.  Cardiovascular:     Rate and Rhythm: Normal rate and regular rhythm.     Pulses:  Normal pulses.     Heart sounds: Normal heart sounds.  Pulmonary:     Effort: Pulmonary effort is normal.     Breath sounds: Normal breath sounds.  Abdominal:     Palpations: Abdomen is soft.     Tenderness: There is no abdominal tenderness.  Musculoskeletal:     Cervical back:  No tenderness.  Lymphadenopathy:     Cervical: No cervical adenopathy.  Skin:    General: Skin is warm and dry.     Capillary Refill: Capillary refill takes less than 2 seconds.  Neurological:     General: No focal deficit present.     Mental Status: He is alert and oriented to person, place, and time.  Psychiatric:        Mood and Affect: Mood normal.        Behavior: Behavior normal.      ASSESSMENT & PLAN: Problem List Items Addressed This Visit       Cardiovascular and Mediastinum   Essential hypertension   BP Readings from Last 3 Encounters:  11/28/24 (!) 140/80  10/11/24 (!) 152/85  07/31/24 (!) 155/96  Elevated blood pressure readings in the office but normal at home Cardiovascular risks associated with hypertension discussed Dietary approaches to stop hypertension discussed Recommend to continue amlodipine  10 mg daily       Relevant Orders   CBC with Differential/Platelet   Comprehensive metabolic panel with GFR   Lipid panel     Digestive   Gastroesophageal reflux disease with esophagitis without hemorrhage   Recent symptoms affecting quality of life Diet and nutrition discussed Was able to identify triggers in his diet and is now avoiding them Recommend trial of pantoprazole 40 mg daily for 3 to 4 weeks May need upper endoscopy if symptoms not significantly improved.      Relevant Medications   pantoprazole (PROTONIX) 40 MG tablet   Other Relevant Orders   CBC with Differential/Platelet     Nervous and Auditory   Generalized convulsive epilepsy (HCC)   Stable.  No recent seizures Continues Tegretol  400 mg at bedtime        Other   Dyslipidemia   Stable chronic  condition Diet and nutrition discussed Lipid profile done today Continue rosuvastatin  20 mg daily.  The 10-year ASCVD risk score (Arnett DK, et al., 2019) is: 13.4%   Values used to calculate the score:     Age: 89 years     Clincally relevant sex: Male     Is Non-Hispanic African American: No     Diabetic: No     Tobacco smoker: No     Systolic Blood Pressure: 120 mmHg     Is BP treated: Yes     HDL Cholesterol: 63.8 mg/dL     Total Cholesterol: 198 mg/dL       Relevant Orders   Comprehensive metabolic panel with GFR   Lipid panel   Other Visit Diagnoses       Encounter for general adult medical examination with abnormal findings    -  Primary   Relevant Orders   CBC with Differential/Platelet   Comprehensive metabolic panel with GFR   Hemoglobin A1c   PSA   Lipid panel     Screening for deficiency anemia       Relevant Orders   CBC with Differential/Platelet     Screening for endocrine, metabolic and immunity disorder       Relevant Orders   Comprehensive metabolic panel with GFR   Hemoglobin A1c     Screening for prostate cancer       Relevant Orders   PSA      Modifiable risk factors discussed with patient. Anticipatory guidance according to age provided. The following topics were also discussed: Social Determinants of Health Smoking.  Non-smoker Diet and nutrition Benefits of exercise Cancer screening  and review of most recent colonoscopy report from 2018 Vaccinations review and recommendations Cardiovascular risk assessment The 10-year ASCVD risk score (Arnett DK, et al., 2019) is: 13.4%   Values used to calculate the score:     Age: 55 years     Clincally relevant sex: Male     Is Non-Hispanic African American: No     Diabetic: No     Tobacco smoker: No     Systolic Blood Pressure: 120 mmHg     Is BP treated: Yes     HDL Cholesterol: 63.8 mg/dL     Total Cholesterol: 198 mg/dL Review of chronic medical conditions under management Review of all  medications Mental health including depression and anxiety Fall and accident prevention  Patient Instructions  Health Maintenance, Male Adopting a healthy lifestyle and getting preventive care are important in promoting health and wellness. Ask your health care provider about: The right schedule for you to have regular tests and exams. Things you can do on your own to prevent diseases and keep yourself healthy. What should I know about diet, weight, and exercise? Eat a healthy diet  Eat a diet that includes plenty of vegetables, fruits, low-fat dairy products, and lean protein. Do not eat a lot of foods that are high in solid fats, added sugars, or sodium. Maintain a healthy weight Body mass index (BMI) is a measurement that can be used to identify possible weight problems. It estimates body fat based on height and weight. Your health care provider can help determine your BMI and help you achieve or maintain a healthy weight. Get regular exercise Get regular exercise. This is one of the most important things you can do for your health. Most adults should: Exercise for at least 150 minutes each week. The exercise should increase your heart rate and make you sweat (moderate-intensity exercise). Do strengthening exercises at least twice a week. This is in addition to the moderate-intensity exercise. Spend less time sitting. Even light physical activity can be beneficial. Watch cholesterol and blood lipids Have your blood tested for lipids and cholesterol at 67 years of age, then have this test every 5 years. You may need to have your cholesterol levels checked more often if: Your lipid or cholesterol levels are high. You are older than 67 years of age. You are at high risk for heart disease. What should I know about cancer screening? Many types of cancers can be detected early and may often be prevented. Depending on your health history and family history, you may need to have cancer  screening at various ages. This may include screening for: Colorectal cancer. Prostate cancer. Skin cancer. Lung cancer. What should I know about heart disease, diabetes, and high blood pressure? Blood pressure and heart disease High blood pressure causes heart disease and increases the risk of stroke. This is more likely to develop in people who have high blood pressure readings or are overweight. Talk with your health care provider about your target blood pressure readings. Have your blood pressure checked: Every 3-5 years if you are 35-18 years of age. Every year if you are 65 years old or older. If you are between the ages of 58 and 50 and are a current or former smoker, ask your health care provider if you should have a one-time screening for abdominal aortic aneurysm (AAA). Diabetes Have regular diabetes screenings. This checks your fasting blood sugar level. Have the screening done: Once every three years after age 62 if you are  at a normal weight and have a low risk for diabetes. More often and at a younger age if you are overweight or have a high risk for diabetes. What should I know about preventing infection? Hepatitis B If you have a higher risk for hepatitis B, you should be screened for this virus. Talk with your health care provider to find out if you are at risk for hepatitis B infection. Hepatitis C Blood testing is recommended for: Everyone born from [birth year] through 1965. Anyone with known risk factors for hepatitis C. Sexually transmitted infections (STIs) You should be screened each year for STIs, including gonorrhea and chlamydia, if: You are sexually active and are younger than 67 years of age. You are older than 67 years of age and your health care provider tells you that you are at risk for this type of infection. Your sexual activity has changed since you were last screened, and you are at increased risk for chlamydia or gonorrhea. Ask your health care provider if  you are at risk. Ask your health care provider about whether you are at high risk for HIV. Your health care provider may recommend a prescription medicine to help prevent HIV infection. If you choose to take medicine to prevent HIV, you should first get tested for HIV. You should then be tested every 3 months for as long as you are taking the medicine. Follow these instructions at home: Alcohol use Do not drink alcohol if your health care provider tells you not to drink. If you drink alcohol: Limit how much you have to 0-2 drinks a day. Know how much alcohol is in your drink. In the [place] one drink equals one 12 oz bottle of beer (355 mL), one 5 oz glass of wine (148 mL), or one 1 oz glass of hard liquor (44 mL). Lifestyle Do not use any products that contain nicotine or tobacco. These products include cigarettes, chewing tobacco, and vaping devices, such as e-cigarettes. If you need help quitting, ask your health care provider. Do not use street drugs. Do not share needles. Ask your health care provider for help if you need support or information about quitting drugs. General instructions Schedule regular health, dental, and eye exams. Stay current with your vaccines. Tell your health care provider if: You often feel depressed. You have ever been abused or do not feel safe at home. Summary Adopting a healthy lifestyle and getting preventive care are important in promoting health and wellness. Follow your health care provider's instructions about healthy diet, exercising, and getting tested or screened for diseases. Follow your health care provider's instructions on monitoring your cholesterol and blood pressure. This information is not intended to replace advice given to you by your health care provider. Make sure you discuss any questions you have with your health care provider. Document Revised: 05/03/2021 Document Reviewed: 05/03/2021 Elsevier Patient Education  2024 Elsevier Inc.      Emil Schaumann, MD [facility]

## 2024-11-28 NOTE — Assessment & Plan Note (Signed)
 Recent symptoms affecting quality of life Diet and nutrition discussed Was able to identify triggers in his diet and is now avoiding them Recommend trial of pantoprazole 40 mg daily for 3 to 4 weeks May need upper endoscopy if symptoms not significantly improved.

## 2024-11-28 NOTE — Patient Instructions (Signed)
 Health Maintenance, Male  Adopting a healthy lifestyle and getting preventive care are important in promoting health and wellness. Ask your health care provider about:  The right schedule for you to have regular tests and exams.  Things you can do on your own to prevent diseases and keep yourself healthy.  What should I know about diet, weight, and exercise?  Eat a healthy diet    Eat a diet that includes plenty of vegetables, fruits, low-fat dairy products, and lean protein.  Do not eat a lot of foods that are high in solid fats, added sugars, or sodium.  Maintain a healthy weight  Body mass index (BMI) is a measurement that can be used to identify possible weight problems. It estimates body fat based on height and weight. Your health care provider can help determine your BMI and help you achieve or maintain a healthy weight.  Get regular exercise  Get regular exercise. This is one of the most important things you can do for your health. Most adults should:  Exercise for at least 150 minutes each week. The exercise should increase your heart rate and make you sweat (moderate-intensity exercise).  Do strengthening exercises at least twice a week. This is in addition to the moderate-intensity exercise.  Spend less time sitting. Even light physical activity can be beneficial.  Watch cholesterol and blood lipids  Have your blood tested for lipids and cholesterol at 68 years of age, then have this test every 5 years.  You may need to have your cholesterol levels checked more often if:  Your lipid or cholesterol levels are high.  You are older than 67 years of age.  You are at high risk for heart disease.  What should I know about cancer screening?  Many types of cancers can be detected early and may often be prevented. Depending on your health history and family history, you may need to have cancer screening at various ages. This may include screening for:  Colorectal cancer.  Prostate cancer.  Skin cancer.  Lung  cancer.  What should I know about heart disease, diabetes, and high blood pressure?  Blood pressure and heart disease  High blood pressure causes heart disease and increases the risk of stroke. This is more likely to develop in people who have high blood pressure readings or are overweight.  Talk with your health care provider about your target blood pressure readings.  Have your blood pressure checked:  Every 3-5 years if you are 24-52 years of age.  Every year if you are 3 years old or older.  If you are between the ages of 60 and 72 and are a current or former smoker, ask your health care provider if you should have a one-time screening for abdominal aortic aneurysm (AAA).  Diabetes  Have regular diabetes screenings. This checks your fasting blood sugar level. Have the screening done:  Once every three years after age 66 if you are at a normal weight and have a low risk for diabetes.  More often and at a younger age if you are overweight or have a high risk for diabetes.  What should I know about preventing infection?  Hepatitis B  If you have a higher risk for hepatitis B, you should be screened for this virus. Talk with your health care provider to find out if you are at risk for hepatitis B infection.  Hepatitis C  Blood testing is recommended for:  Everyone born from [birth year] through 1964.  Anyone  with known risk factors for hepatitis C.  Sexually transmitted infections (STIs)  You should be screened each year for STIs, including gonorrhea and chlamydia, if:  You are sexually active and are younger than 67 years of age.  You are older than 67 years of age and your health care provider tells you that you are at risk for this type of infection.  Your sexual activity has changed since you were last screened, and you are at increased risk for chlamydia or gonorrhea. Ask your health care provider if you are at risk.  Ask your health care provider about whether you are at high risk for HIV. Your health care provider  may recommend a prescription medicine to help prevent HIV infection. If you choose to take medicine to prevent HIV, you should first get tested for HIV. You should then be tested every 3 months for as long as you are taking the medicine.  Follow these instructions at home:  Alcohol use  Do not drink alcohol if your health care provider tells you not to drink.  If you drink alcohol:  Limit how much you have to 0-2 drinks a day.  Know how much alcohol is in your drink. In the [place] one drink equals one 12 oz bottle of beer (355 mL), one 5 oz glass of wine (148 mL), or one 1 oz glass of hard liquor (44 mL).  Lifestyle  Do not use any products that contain nicotine or tobacco. These products include cigarettes, chewing tobacco, and vaping devices, such as e-cigarettes. If you need help quitting, ask your health care provider.  Do not use street drugs.  Do not share needles.  Ask your health care provider for help if you need support or information about quitting drugs.  General instructions  Schedule regular health, dental, and eye exams.  Stay current with your vaccines.  Tell your health care provider if:  You often feel depressed.  You have ever been abused or do not feel safe at home.  Summary  Adopting a healthy lifestyle and getting preventive care are important in promoting health and wellness.  Follow your health care provider's instructions about healthy diet, exercising, and getting tested or screened for diseases.  Follow your health care provider's instructions on monitoring your cholesterol and blood pressure.  This information is not intended to replace advice given to you by your health care provider. Make sure you discuss any questions you have with your health care provider.  Document Revised: 05/03/2021 Document Reviewed: 05/03/2021  Elsevier Patient Education  2024 [employer].

## 2024-11-28 NOTE — Assessment & Plan Note (Signed)
Stable.  No recent seizures Continues Tegretol 400 mg at bedtime

## 2025-01-01 ENCOUNTER — Ambulatory Visit

## 2025-05-27 ENCOUNTER — Ambulatory Visit
